# Patient Record
Sex: Female | Born: 1950 | Race: Black or African American | Hispanic: No | Marital: Single | Smoking: Former smoker
Health system: Southern US, Community
[De-identification: ages and names within clinical notes are randomized; demographics above are authoritative.]

## PROBLEM LIST (undated history)

## (undated) DIAGNOSIS — J45909 Unspecified asthma, uncomplicated: Secondary | ICD-10-CM

## (undated) DIAGNOSIS — E119 Type 2 diabetes mellitus without complications: Secondary | ICD-10-CM

## (undated) DIAGNOSIS — I10 Essential (primary) hypertension: Secondary | ICD-10-CM

## (undated) DIAGNOSIS — E7800 Pure hypercholesterolemia, unspecified: Secondary | ICD-10-CM

---

## 1998-11-28 ENCOUNTER — Emergency Department (HOSPITAL_COMMUNITY): Admission: EM | Admit: 1998-11-28 | Discharge: 1998-11-28 | Payer: Self-pay | Admitting: Emergency Medicine

## 1998-11-29 ENCOUNTER — Encounter: Payer: Self-pay | Admitting: Emergency Medicine

## 1998-12-17 ENCOUNTER — Emergency Department (HOSPITAL_COMMUNITY): Admission: EM | Admit: 1998-12-17 | Discharge: 1998-12-17 | Payer: Self-pay | Admitting: Internal Medicine

## 1999-02-23 ENCOUNTER — Emergency Department (HOSPITAL_COMMUNITY): Admission: EM | Admit: 1999-02-23 | Discharge: 1999-02-23 | Payer: Self-pay

## 1999-03-15 ENCOUNTER — Encounter: Admission: RE | Admit: 1999-03-15 | Discharge: 1999-03-15 | Payer: Self-pay | Admitting: Obstetrics

## 2000-02-18 ENCOUNTER — Emergency Department (HOSPITAL_COMMUNITY): Admission: EM | Admit: 2000-02-18 | Discharge: 2000-02-18 | Payer: Self-pay | Admitting: Emergency Medicine

## 2002-01-29 ENCOUNTER — Encounter: Payer: Self-pay | Admitting: Emergency Medicine

## 2002-01-29 ENCOUNTER — Emergency Department (HOSPITAL_COMMUNITY): Admission: EM | Admit: 2002-01-29 | Discharge: 2002-01-29 | Payer: Self-pay | Admitting: Emergency Medicine

## 2003-10-02 ENCOUNTER — Emergency Department (HOSPITAL_COMMUNITY): Admission: EM | Admit: 2003-10-02 | Discharge: 2003-10-02 | Payer: Self-pay | Admitting: Emergency Medicine

## 2003-10-19 ENCOUNTER — Ambulatory Visit (HOSPITAL_COMMUNITY): Admission: RE | Admit: 2003-10-19 | Discharge: 2003-10-19 | Payer: Self-pay | Admitting: Internal Medicine

## 2003-11-03 ENCOUNTER — Ambulatory Visit (HOSPITAL_COMMUNITY): Admission: RE | Admit: 2003-11-03 | Discharge: 2003-11-03 | Payer: Self-pay | Admitting: Family Medicine

## 2003-11-12 ENCOUNTER — Emergency Department (HOSPITAL_COMMUNITY): Admission: EM | Admit: 2003-11-12 | Discharge: 2003-11-12 | Payer: Self-pay | Admitting: *Deleted

## 2003-12-06 ENCOUNTER — Ambulatory Visit: Payer: Self-pay | Admitting: *Deleted

## 2003-12-20 ENCOUNTER — Ambulatory Visit: Payer: Self-pay | Admitting: Internal Medicine

## 2003-12-30 ENCOUNTER — Ambulatory Visit: Payer: Self-pay | Admitting: *Deleted

## 2004-02-05 ENCOUNTER — Emergency Department (HOSPITAL_COMMUNITY): Admission: EM | Admit: 2004-02-05 | Discharge: 2004-02-06 | Payer: Self-pay | Admitting: Emergency Medicine

## 2004-07-24 ENCOUNTER — Ambulatory Visit: Payer: Self-pay | Admitting: Internal Medicine

## 2004-07-25 ENCOUNTER — Ambulatory Visit: Payer: Self-pay | Admitting: Internal Medicine

## 2004-10-12 ENCOUNTER — Ambulatory Visit: Payer: Self-pay | Admitting: Internal Medicine

## 2005-05-13 ENCOUNTER — Ambulatory Visit: Payer: Self-pay | Admitting: Internal Medicine

## 2005-06-12 ENCOUNTER — Ambulatory Visit: Payer: Self-pay | Admitting: Internal Medicine

## 2005-07-03 ENCOUNTER — Ambulatory Visit: Payer: Self-pay | Admitting: Internal Medicine

## 2005-07-23 ENCOUNTER — Encounter: Payer: Self-pay | Admitting: Internal Medicine

## 2005-07-23 ENCOUNTER — Ambulatory Visit: Payer: Self-pay | Admitting: Internal Medicine

## 2006-05-16 ENCOUNTER — Emergency Department (HOSPITAL_COMMUNITY): Admission: EM | Admit: 2006-05-16 | Discharge: 2006-05-16 | Payer: Self-pay | Admitting: Emergency Medicine

## 2006-06-05 ENCOUNTER — Ambulatory Visit: Payer: Self-pay | Admitting: Internal Medicine

## 2006-10-27 ENCOUNTER — Ambulatory Visit: Payer: Self-pay | Admitting: Internal Medicine

## 2006-12-17 ENCOUNTER — Encounter (INDEPENDENT_AMBULATORY_CARE_PROVIDER_SITE_OTHER): Payer: Self-pay | Admitting: *Deleted

## 2007-02-07 ENCOUNTER — Emergency Department (HOSPITAL_COMMUNITY): Admission: EM | Admit: 2007-02-07 | Discharge: 2007-02-07 | Payer: Self-pay | Admitting: Emergency Medicine

## 2007-03-19 ENCOUNTER — Ambulatory Visit: Payer: Self-pay | Admitting: Internal Medicine

## 2007-04-01 ENCOUNTER — Ambulatory Visit (HOSPITAL_COMMUNITY): Admission: RE | Admit: 2007-04-01 | Discharge: 2007-04-01 | Payer: Self-pay | Admitting: Family Medicine

## 2007-09-29 ENCOUNTER — Ambulatory Visit: Payer: Self-pay | Admitting: Internal Medicine

## 2008-02-11 ENCOUNTER — Ambulatory Visit: Payer: Self-pay | Admitting: Internal Medicine

## 2008-02-23 ENCOUNTER — Ambulatory Visit: Payer: Self-pay | Admitting: Internal Medicine

## 2008-04-25 ENCOUNTER — Ambulatory Visit (HOSPITAL_COMMUNITY): Admission: RE | Admit: 2008-04-25 | Discharge: 2008-04-25 | Payer: Self-pay | Admitting: Internal Medicine

## 2009-03-13 ENCOUNTER — Ambulatory Visit (HOSPITAL_COMMUNITY): Admission: RE | Admit: 2009-03-13 | Discharge: 2009-03-13 | Payer: Self-pay | Admitting: Internal Medicine

## 2009-04-17 ENCOUNTER — Ambulatory Visit: Payer: Self-pay | Admitting: Internal Medicine

## 2009-04-26 ENCOUNTER — Ambulatory Visit (HOSPITAL_COMMUNITY): Admission: RE | Admit: 2009-04-26 | Discharge: 2009-04-26 | Payer: Self-pay | Admitting: Internal Medicine

## 2009-06-22 ENCOUNTER — Ambulatory Visit: Payer: Self-pay | Admitting: Internal Medicine

## 2009-10-11 ENCOUNTER — Emergency Department (HOSPITAL_COMMUNITY): Admission: EM | Admit: 2009-10-11 | Discharge: 2009-10-11 | Payer: Self-pay | Admitting: Family Medicine

## 2009-11-20 ENCOUNTER — Ambulatory Visit: Payer: Self-pay | Admitting: Internal Medicine

## 2009-11-20 LAB — CONVERTED CEMR LAB
BUN: 13 mg/dL (ref 6–23)
CO2: 27 meq/L (ref 19–32)
HDL: 39 mg/dL — ABNORMAL LOW (ref >39)
LDL Cholesterol: 107 mg/dL — ABNORMAL HIGH (ref 0–99)
Triglycerides: 107 mg/dL (ref <150)
VLDL: 21 mg/dL (ref 0–40)

## 2010-05-01 ENCOUNTER — Ambulatory Visit (HOSPITAL_COMMUNITY): Admission: RE | Admit: 2010-05-01 | Payer: Self-pay | Source: Home / Self Care | Admitting: Internal Medicine

## 2010-05-01 ENCOUNTER — Other Ambulatory Visit (HOSPITAL_COMMUNITY): Payer: Self-pay | Admitting: Internal Medicine

## 2010-05-01 DIAGNOSIS — Z0000 Encounter for general adult medical examination without abnormal findings: Secondary | ICD-10-CM

## 2010-05-01 DIAGNOSIS — Z1231 Encounter for screening mammogram for malignant neoplasm of breast: Secondary | ICD-10-CM

## 2010-05-05 ENCOUNTER — Encounter: Payer: Self-pay | Admitting: Internal Medicine

## 2010-05-10 ENCOUNTER — Ambulatory Visit (HOSPITAL_COMMUNITY)
Admission: RE | Admit: 2010-05-10 | Discharge: 2010-05-10 | Disposition: A | Discharge status: Home / Self Care | Payer: Self-pay | Source: Ambulatory Visit | Attending: Internal Medicine | Admitting: Internal Medicine

## 2010-05-10 DIAGNOSIS — Z1231 Encounter for screening mammogram for malignant neoplasm of breast: Secondary | ICD-10-CM | POA: Insufficient documentation

## 2010-06-17 LAB — POCT I-STAT, CHEM 8
Calcium, Ion: 1.08 mmol/L — ABNORMAL LOW (ref 1.12–1.32)
Chloride: 101 mEq/L (ref 96–112)
Glucose, Bld: 100 mg/dL — ABNORMAL HIGH (ref 70–99)
Potassium: 3.8 mEq/L (ref 3.5–5.1)

## 2010-06-17 LAB — GLUCOSE, CAPILLARY: Glucose-Capillary: 97 mg/dL (ref 70–99)

## 2011-04-24 ENCOUNTER — Other Ambulatory Visit (HOSPITAL_COMMUNITY): Payer: Self-pay | Admitting: Family Medicine

## 2011-04-24 DIAGNOSIS — Z1231 Encounter for screening mammogram for malignant neoplasm of breast: Secondary | ICD-10-CM

## 2011-05-22 ENCOUNTER — Ambulatory Visit (HOSPITAL_COMMUNITY)
Admission: RE | Admit: 2011-05-22 | Discharge: 2011-05-22 | Disposition: A | Discharge status: Home / Self Care | Payer: Self-pay | Source: Ambulatory Visit | Attending: Family Medicine | Admitting: Family Medicine

## 2011-05-22 DIAGNOSIS — Z1231 Encounter for screening mammogram for malignant neoplasm of breast: Secondary | ICD-10-CM | POA: Insufficient documentation

## 2012-05-22 ENCOUNTER — Other Ambulatory Visit (HOSPITAL_COMMUNITY): Payer: Self-pay | Admitting: Family Medicine

## 2012-05-22 DIAGNOSIS — Z1231 Encounter for screening mammogram for malignant neoplasm of breast: Secondary | ICD-10-CM

## 2012-05-28 ENCOUNTER — Ambulatory Visit (HOSPITAL_COMMUNITY)
Admission: RE | Admit: 2012-05-28 | Discharge: 2012-05-28 | Disposition: A | Discharge status: Home / Self Care | Payer: Self-pay | Source: Ambulatory Visit | Attending: Family Medicine | Admitting: Family Medicine

## 2012-05-28 DIAGNOSIS — Z1231 Encounter for screening mammogram for malignant neoplasm of breast: Secondary | ICD-10-CM | POA: Insufficient documentation

## 2013-07-16 ENCOUNTER — Other Ambulatory Visit (HOSPITAL_COMMUNITY): Payer: Self-pay | Admitting: Pediatrics

## 2013-07-16 DIAGNOSIS — Z1231 Encounter for screening mammogram for malignant neoplasm of breast: Secondary | ICD-10-CM

## 2013-07-22 ENCOUNTER — Ambulatory Visit (HOSPITAL_COMMUNITY): Payer: Self-pay

## 2013-07-27 ENCOUNTER — Ambulatory Visit (HOSPITAL_COMMUNITY)
Admission: RE | Admit: 2013-07-27 | Discharge: 2013-07-27 | Disposition: A | Discharge status: Home / Self Care | Payer: Self-pay | Source: Ambulatory Visit | Attending: Pediatrics | Admitting: Pediatrics

## 2013-07-27 DIAGNOSIS — Z1231 Encounter for screening mammogram for malignant neoplasm of breast: Secondary | ICD-10-CM

## 2014-02-20 ENCOUNTER — Emergency Department (HOSPITAL_COMMUNITY)
Admission: EM | Admit: 2014-02-20 | Discharge: 2014-02-20 | Disposition: A | Discharge status: Home / Self Care | Payer: Medicare HMO | Attending: Emergency Medicine | Admitting: Emergency Medicine

## 2014-02-20 ENCOUNTER — Encounter (HOSPITAL_COMMUNITY): Payer: Self-pay | Admitting: *Deleted

## 2014-02-20 ENCOUNTER — Emergency Department (HOSPITAL_COMMUNITY): Payer: Medicare HMO

## 2014-02-20 DIAGNOSIS — I10 Essential (primary) hypertension: Secondary | ICD-10-CM | POA: Insufficient documentation

## 2014-02-20 DIAGNOSIS — Z880 Allergy status to penicillin: Secondary | ICD-10-CM | POA: Diagnosis not present

## 2014-02-20 DIAGNOSIS — Z7982 Long term (current) use of aspirin: Secondary | ICD-10-CM | POA: Diagnosis not present

## 2014-02-20 DIAGNOSIS — S52601A Unspecified fracture of lower end of right ulna, initial encounter for closed fracture: Secondary | ICD-10-CM | POA: Insufficient documentation

## 2014-02-20 DIAGNOSIS — Z79899 Other long term (current) drug therapy: Secondary | ICD-10-CM | POA: Diagnosis not present

## 2014-02-20 DIAGNOSIS — E119 Type 2 diabetes mellitus without complications: Secondary | ICD-10-CM | POA: Diagnosis not present

## 2014-02-20 DIAGNOSIS — W19XXXA Unspecified fall, initial encounter: Secondary | ICD-10-CM

## 2014-02-20 DIAGNOSIS — T1490XA Injury, unspecified, initial encounter: Secondary | ICD-10-CM

## 2014-02-20 DIAGNOSIS — Y998 Other external cause status: Secondary | ICD-10-CM | POA: Insufficient documentation

## 2014-02-20 DIAGNOSIS — W228XXA Striking against or struck by other objects, initial encounter: Secondary | ICD-10-CM | POA: Diagnosis not present

## 2014-02-20 DIAGNOSIS — Z720 Tobacco use: Secondary | ICD-10-CM | POA: Diagnosis not present

## 2014-02-20 DIAGNOSIS — S6991XA Unspecified injury of right wrist, hand and finger(s), initial encounter: Secondary | ICD-10-CM | POA: Diagnosis present

## 2014-02-20 DIAGNOSIS — Y9289 Other specified places as the place of occurrence of the external cause: Secondary | ICD-10-CM | POA: Insufficient documentation

## 2014-02-20 DIAGNOSIS — Y9389 Activity, other specified: Secondary | ICD-10-CM | POA: Diagnosis not present

## 2014-02-20 HISTORY — DX: Essential (primary) hypertension: I10

## 2014-02-20 HISTORY — DX: Type 2 diabetes mellitus without complications: E11.9

## 2014-02-20 MED ORDER — HYDROCODONE-ACETAMINOPHEN 5-325 MG PO TABS: 1.0000 | ORAL_TABLET | Freq: Four times a day (QID) | ORAL | Status: DC | PRN

## 2014-02-20 NOTE — ED Notes (Signed)
Paged and spoke with ortho tech.

## 2014-02-20 NOTE — ED Notes (Signed)
The ptt has pain and swelling to her rt wrist.  She struck it on a piece of wood outside Friday.

## 2014-02-20 NOTE — ED Provider Notes (Signed)
CSN: [id number]     Arrival date & time 02/20/14  1510 History  This chart was scribed for Noland Fordyce, PA-C, working with Evelina Bucy, MD by Steva Colder, ED Scribe. The patient was seen in room TR04C/TR04C at 4:28 PM.    Chief Complaint  Patient presents with  . Wrist Injury     The history is provided by the patient. No language interpreter was used.   HPI Comments: Jacqueline Simon is a 63 y.o. female with medical hx of DM who presents to the Emergency Department complaining of right wrist injury onset 2 days ago. She rates her pain as 10/10, while moving. Pt reports that she struck it on a piece of wood while outside and cleaning feces from her dog 2 days ago. Her dog was excited to see her and while she was trying to get the dog off she hit her wrist on either the rail or chair. When the pain first occurred, she got sick on the stomach. 2 days ago the wrist had a bump on it that the pt paid no attention too. Saturday is when the pt noticed more pain and swelling. There is pain with movement of her wrist. She states that she is having associated symptoms of joint swelling and wrist pain. Pain is 10/10 at worst, mild to moderate while at rest. She states that she has tried Tylenol with moderate relief for her symptoms. She denies any other associated symptoms. She denies neuropathy. Pt is right handed and has been using her left hand since the incident. Pt is a daily smoker. PCP- Simona Huh, MD   Past Medical History  Diagnosis Date  . Hypertension   . Diabetes mellitus without complication    History reviewed. No pertinent past surgical history. No family history on file. History  Substance Use Topics  . Smoking status: Current Every Day Smoker  . Smokeless tobacco: Not on file  . Alcohol Use: No   OB History    No data available     Review of Systems  Musculoskeletal: Positive for joint swelling and arthralgias.    Allergies  Penicillins  Home Medications   Prior  to Admission medications   Medication Sig Start Date End Date Taking? Authorizing Provider  acetaminophen (TYLENOL) 500 MG tablet Take 1,000 mg by mouth 2 (two) times daily as needed for headache (pain).   Yes Historical Provider, MD  albuterol (PROVENTIL HFA;VENTOLIN HFA) 108 (90 BASE) MCG/ACT inhaler Inhale 1 puff into the lungs at bedtime as needed for wheezing or shortness of breath.   Yes Historical Provider, MD  aspirin EC 81 MG tablet Take 81 mg by mouth daily.   Yes Historical Provider, MD  cetirizine-pseudoephedrine (ZYRTEC-D) 5-120 MG per tablet Take 1 tablet by mouth daily.   Yes Historical Provider, MD  desloratadine (CLARINEX) 5 MG tablet Take 5 mg by mouth daily.   Yes Historical Provider, MD  fluticasone (FLOVENT HFA) 220 MCG/ACT inhaler Inhale 2 puffs into the lungs at bedtime as needed (shortness of breath/wheezing).   Yes Historical Provider, MD  hydrochlorothiazide (HYDRODIURIL) 25 MG tablet Take 25 mg by mouth daily.  02/06/14  Yes Historical Provider, MD  metFORMIN (GLUCOPHAGE-XR) 500 MG 24 hr tablet Take 1,000 mg by mouth 2 (two) times daily.  01/17/14  Yes Historical Provider, MD  Multiple Vitamin (MULTIVITAMIN WITH MINERALS) TABS tablet Take 1 tablet by mouth daily.   Yes Historical Provider, MD  HYDROcodone-acetaminophen (NORCO/VICODIN) 5-325 MG per tablet Take 1-2 tablets by  mouth every 6 (six) hours as needed for moderate pain or severe pain. 02/20/14   Noland Fordyce, PA-C   BP 181/74 mmHg  Pulse 88  Temp(Src) 98 F (36.7 C)  Resp 18  Ht 5\' 3"  (1.6 m)  Wt 205 lb (92.987 kg)  BMI 36.32 kg/m2  SpO2 98%  Physical Exam  Constitutional: She is oriented to person, place, and time. She appears well-developed and well-nourished. No distress.  HENT:  Head: Normocephalic and atraumatic.  Eyes: EOM are normal.  Neck: Neck supple. No tracheal deviation present.  Cardiovascular: Normal rate.   Pulses:      Radial pulses are 2+ on the right side.  Radial pulse is 2+ of the  right wrist.  Pulmonary/Chest: Effort normal. No respiratory distress.  Musculoskeletal: Normal range of motion. She exhibits edema and tenderness.       Right wrist: She exhibits tenderness and swelling.  Moderate edema to the dorsal aspect of the right wrist. With tenderness along the ulnar aspect. Limited extension and flexion of the wrist due to pain. 5/5 grip strength. Full ROM of all the fingers. Sensation intact. cap refill is less than 3 seconds.   Neurological: She is alert and oriented to person, place, and time.  Skin: Skin is warm and dry. No erythema.  Skin intact. No erythema or ecchymosis.   Psychiatric: She has a normal mood and affect. Her behavior is normal.  Nursing note and vitals reviewed.   ED Course  Procedures (including critical care time) DIAGNOSTIC STUDIES: Oxygen Saturation is 98% on room air, normal by my interpretation.    COORDINATION OF CARE: 4:36 PM-Discussed treatment plan which includes right wrist X-ray, right wrist splint, F/U with orthopedist, and pain medication with pt at bedside and pt agreed to plan.   Labs Review Labs Reviewed - No data to display  Imaging Review Dg Wrist Complete Right  02/20/2014   CLINICAL DATA:  63 year old female tripped and fell over dog 2 days ago with blunt trauma, pain on the medial right wrist. Initial encounter.  EXAM: RIGHT WRIST - COMPLETE 3+ VIEW  COMPARISON:  None.  FINDINGS: Nondisplaced oblique fracture through the distal right ulna at the base of the ulnar styloid (see arrows).  Distal radius appears intact. Carpal bone alignment within normal limits. Suboptimal scaphoid view, no scaphoid fracture identified. Visible metacarpals appear intact.  IMPRESSION: Nondisplaced oblique fracture of the distal ulna, through the base of the ulnar styloid.   Electronically Signed   By: Lars Pinks M.D.   On: 02/20/2014 16:28     EKG Interpretation None      MDM   Final diagnoses:  Right distal ulnar fracture, closed,  initial encounter    Right wrist pain and swelling after hitting on chair or pole 2 days ago. Hand is neurovascularly in tact. Plain films: nondisplaced oblique fracture. Ulnar-gutter splint with sling placed in ED. Advised to f/u with Dr. Grandville Silos, orthopedics in 1-2 weeks for recheck and continued management. Pt verbalized understanding and agreement with tx plan.  I personally performed the services described in this documentation, which was scribed in my presence. The recorded information has been reviewed and is accurate.    Noland Fordyce, PA-C 02/20/14 [place], MD 02/20/14 [phone]

## 2014-02-20 NOTE — Progress Notes (Signed)
Orthopedic Tech Progress Note Patient Details:  Jacqueline Simon 1950-06-21 [id number] Applied fiberglass ulnar gutter splint to RUE.  Pulses, sensation, motion intact before and after application.  Capillary refill less than 2 seconds before and after application.  Placed RUE in an arm sling. Ortho Devices Type of Ortho Device: Ulna gutter splint, Arm sling Ortho Device/Splint Location: RUE Ortho Device/Splint Interventions: Application   Darrol Poke 02/20/2014, 5:15 PM

## 2014-02-20 NOTE — Discharge Instructions (Signed)
You may take acetaminophen (tylenol) every 4-6 hours as needed for pain.  Hydrocodone/acetaminophen should be taken only for severe pain as prescribed.  Do not take regular tylenol at the same time as this medication already has tylenol included.  It may cause drowsiness. Do not drive while taking.  Be sure to keep your right hand elevated above your heart by the help of your sling and use pillows when lying in bed.  Elevation will help with swelling and pain.  You may also apply ice over your splint by putting a thin washcloth between ice and splint for 15-20 minutes, 3-4 times a day to help with swelling and pain.  See below for further instructions.

## 2014-04-13 DIAGNOSIS — N951 Menopausal and female climacteric states: Secondary | ICD-10-CM | POA: Diagnosis not present

## 2014-04-13 DIAGNOSIS — Z13820 Encounter for screening for osteoporosis: Secondary | ICD-10-CM | POA: Diagnosis not present

## 2014-05-05 DIAGNOSIS — S52601D Unspecified fracture of lower end of right ulna, subsequent encounter for closed fracture with routine healing: Secondary | ICD-10-CM | POA: Diagnosis not present

## 2014-06-23 DIAGNOSIS — S52601D Unspecified fracture of lower end of right ulna, subsequent encounter for closed fracture with routine healing: Secondary | ICD-10-CM | POA: Diagnosis not present

## 2014-07-27 ENCOUNTER — Encounter: Payer: Self-pay | Admitting: Podiatry

## 2014-07-27 ENCOUNTER — Ambulatory Visit (INDEPENDENT_AMBULATORY_CARE_PROVIDER_SITE_OTHER): Payer: Commercial Managed Care - HMO | Admitting: Podiatry

## 2014-07-27 VITALS — BP 143/82 | HR 87 | Resp 18

## 2014-07-27 DIAGNOSIS — B351 Tinea unguium: Secondary | ICD-10-CM | POA: Diagnosis not present

## 2014-07-27 DIAGNOSIS — M79676 Pain in unspecified toe(s): Secondary | ICD-10-CM | POA: Diagnosis not present

## 2014-07-27 NOTE — Progress Notes (Signed)
   Subjective:    Patient ID: Jacqueline Simon, female    DOB: 1950/12/16, 64 y.o.   MRN: [id number]  HPI  64 year old female presented to the office they with complaint of painful, thick, elongated toenails for which she is unable to trim herself. She denies any redness or drains on nail sites. She denies any history of ulceration, tailor numbness or any claudication symptoms. She states that she does not check her blood sugar and regular basis as she does not have any test strips. No other complaints at this time.   Review of Systems  Constitutional:       SWEATING   HENT: Positive for sinus pressure.   Eyes: Positive for itching.  Gastrointestinal:       BLOATING  Endocrine:       EXCESSIVE THIRST  Skin: Positive for color change.       CHANGE IN NAILS  Hematological:       SLOW TO HEAL  All other systems reviewed and are negative.      Objective:   Physical Exam AAO x3, NAD DP/PT pulses palpable b/l, CRT < 3 sec Protective sensation intact with SWMF, vibratory sensation intact, Achilles tendon reflex intact Nails are hypertrophic, dystrophic, elongated, brittle, discolored 10. There subjective tenderness on nails 1-5 bilaterally. There is no surrounding erythema or drainage along the nail sites. Left plantar heel hyperkeratotic lesion. Upon debridement lesion there is no underlying ulceration, drainage or other clinical signs of infection. No other areas of tenderness to bilateral lower extremities. There is no overlying edema, erythema, increase in warmth bilaterally. MMT 5/5, ROM WNL No open lesions or other pre-ulcerative lesions     Assessment & Plan:  64 year old female with symptomatic onychomycosis, left heel hyperkeratotic lesion -Treatment options discussed including alternatives, risks, complications -Nail sharply debrided 10 without complications to bleeding -Hyperkeratotic lesion sharply debride 1 without complication/bleeding -Discussed importance of daily  foot inspection -Follow-up in 3 months or sooner if any problems are to arise. Call with questions or concerns in the meantime.

## 2014-08-17 ENCOUNTER — Other Ambulatory Visit (HOSPITAL_COMMUNITY): Payer: Self-pay | Admitting: Pediatrics

## 2014-08-17 DIAGNOSIS — Z1231 Encounter for screening mammogram for malignant neoplasm of breast: Secondary | ICD-10-CM

## 2014-08-23 ENCOUNTER — Ambulatory Visit (HOSPITAL_COMMUNITY)
Admission: RE | Admit: 2014-08-23 | Discharge: 2014-08-23 | Disposition: A | Discharge status: Home / Self Care | Payer: Commercial Managed Care - HMO | Source: Ambulatory Visit | Attending: Pediatrics | Admitting: Pediatrics

## 2014-08-23 DIAGNOSIS — Z1231 Encounter for screening mammogram for malignant neoplasm of breast: Secondary | ICD-10-CM | POA: Diagnosis not present

## 2014-09-26 DIAGNOSIS — E780 Pure hypercholesterolemia: Secondary | ICD-10-CM | POA: Diagnosis not present

## 2014-09-26 DIAGNOSIS — J45909 Unspecified asthma, uncomplicated: Secondary | ICD-10-CM | POA: Diagnosis not present

## 2014-09-26 DIAGNOSIS — Z0000 Encounter for general adult medical examination without abnormal findings: Secondary | ICD-10-CM | POA: Diagnosis not present

## 2014-09-26 DIAGNOSIS — I10 Essential (primary) hypertension: Secondary | ICD-10-CM | POA: Diagnosis not present

## 2014-09-26 DIAGNOSIS — Z720 Tobacco use: Secondary | ICD-10-CM | POA: Diagnosis not present

## 2014-09-26 DIAGNOSIS — Z1211 Encounter for screening for malignant neoplasm of colon: Secondary | ICD-10-CM | POA: Diagnosis not present

## 2014-09-26 DIAGNOSIS — J309 Allergic rhinitis, unspecified: Secondary | ICD-10-CM | POA: Diagnosis not present

## 2014-09-26 DIAGNOSIS — E1165 Type 2 diabetes mellitus with hyperglycemia: Secondary | ICD-10-CM | POA: Diagnosis not present

## 2014-10-27 ENCOUNTER — Ambulatory Visit: Payer: Commercial Managed Care - HMO | Admitting: Podiatry

## 2014-11-23 ENCOUNTER — Ambulatory Visit (INDEPENDENT_AMBULATORY_CARE_PROVIDER_SITE_OTHER): Payer: Commercial Managed Care - HMO | Admitting: Podiatry

## 2014-11-23 ENCOUNTER — Encounter: Payer: Self-pay | Admitting: Podiatry

## 2014-11-23 VITALS — BP 162/89 | HR 82 | Resp 18

## 2014-11-23 DIAGNOSIS — M79676 Pain in unspecified toe(s): Secondary | ICD-10-CM | POA: Diagnosis not present

## 2014-11-23 DIAGNOSIS — B351 Tinea unguium: Secondary | ICD-10-CM

## 2014-11-23 NOTE — Progress Notes (Signed)
Patient ID: Jacqueline Simon, female   DOB: Jan 15, 1951, 65 y.o.   MRN: [id number] Complaint:  Visit Type: Patient returns to my office for continued preventative foot care services. Complaint: Patient states" my nails have grown long and thick and become painful to walk and wear shoes" Patient has been diagnosed with DM with no foot complications. The patient presents for preventative foot care services. No changes to ROS  Podiatric Exam: Vascular: dorsalis pedis and posterior tibial pulses are palpable bilateral. Capillary return is immediate. Temperature gradient is WNL. Skin turgor WNL  Sensorium: Diminished  Semmes Weinstein monofilament test. Normal tactile sensation bilaterally. Nail Exam: Pt has thick disfigured discolored nails with subungual debris noted bilateral entire nail hallux through fifth toenails Ulcer Exam: There is no evidence of ulcer or pre-ulcerative changes or infection. Orthopedic Exam: Muscle tone and strength are WNL. No limitations in general ROM. No crepitus or effusions noted. Foot type and digits show no abnormalities. Bony prominences are unremarkable. Skin: No Porokeratosis. No infection or ulcers  Diagnosis:  Onychomycosis, , Pain in right toe, pain in left toes  Treatment & Plan Procedures and Treatment: Consent by patient was obtained for treatment procedures. The patient understood the discussion of treatment and procedures well. All questions were answered thoroughly reviewed. Debridement of mycotic and hypertrophic toenails, 1 through 5 bilateral and clearing of subungual debris. No ulceration, no infection noted.  Return Visit-Office Procedure: Patient instructed to return to the office for a follow up visit 3 months for continued evaluation and treatment.

## 2014-12-06 ENCOUNTER — Other Ambulatory Visit: Payer: Self-pay | Admitting: Gastroenterology

## 2014-12-06 DIAGNOSIS — Z1211 Encounter for screening for malignant neoplasm of colon: Secondary | ICD-10-CM | POA: Diagnosis not present

## 2014-12-06 DIAGNOSIS — K5730 Diverticulosis of large intestine without perforation or abscess without bleeding: Secondary | ICD-10-CM | POA: Diagnosis not present

## 2014-12-06 DIAGNOSIS — D128 Benign neoplasm of rectum: Secondary | ICD-10-CM | POA: Diagnosis not present

## 2014-12-06 DIAGNOSIS — K621 Rectal polyp: Secondary | ICD-10-CM | POA: Diagnosis not present

## 2014-12-06 DIAGNOSIS — K648 Other hemorrhoids: Secondary | ICD-10-CM | POA: Diagnosis not present

## 2015-01-25 DIAGNOSIS — Z23 Encounter for immunization: Secondary | ICD-10-CM | POA: Diagnosis not present

## 2015-02-15 ENCOUNTER — Encounter: Payer: Self-pay | Admitting: Podiatry

## 2015-02-15 ENCOUNTER — Ambulatory Visit (INDEPENDENT_AMBULATORY_CARE_PROVIDER_SITE_OTHER): Payer: Commercial Managed Care - HMO | Admitting: Podiatry

## 2015-02-15 DIAGNOSIS — M79676 Pain in unspecified toe(s): Secondary | ICD-10-CM

## 2015-02-15 DIAGNOSIS — B351 Tinea unguium: Secondary | ICD-10-CM

## 2015-02-15 NOTE — Progress Notes (Signed)
Patient ID: Jacqueline Simon, female   DOB: Mar 09, 1951, 64 y.o.   MRN: [id number] Complaint:  Visit Type: Patient returns to my office for continued preventative foot care services. Complaint: Patient states" my nails have grown long and thick and become painful to walk and wear shoes" Patient has been diagnosed with DM with no foot complications. The patient presents for preventative foot care services. No changes to ROS  Podiatric Exam: Vascular: dorsalis pedis and posterior tibial pulses are palpable bilateral. Capillary return is immediate. Temperature gradient is WNL. Skin turgor WNL  Sensorium: Diminished  Semmes Weinstein monofilament test. Normal tactile sensation bilaterally. Nail Exam: Pt has thick disfigured discolored nails with subungual debris noted bilateral entire nail hallux through fifth toenails Ulcer Exam: There is no evidence of ulcer or pre-ulcerative changes or infection. Orthopedic Exam: Muscle tone and strength are WNL. No limitations in general ROM. No crepitus or effusions noted. Foot type and digits show no abnormalities. Bony prominences are unremarkable. Skin: No Porokeratosis. No infection or ulcers  Diagnosis:  Onychomycosis, , Pain in right toe, pain in left toes  Treatment & Plan Procedures and Treatment: Consent by patient was obtained for treatment procedures. The patient understood the discussion of treatment and procedures well. All questions were answered thoroughly reviewed. Debridement of mycotic and hypertrophic toenails, 1 through 5 bilateral and clearing of subungual debris. No ulceration, no infection noted.  Return Visit-Office Procedure: Patient instructed to return to the office for a follow up visit 3 months for continued evaluation and treatment.  Gardiner Barefoot DPM

## 2015-04-12 DIAGNOSIS — J309 Allergic rhinitis, unspecified: Secondary | ICD-10-CM | POA: Diagnosis not present

## 2015-04-12 DIAGNOSIS — I10 Essential (primary) hypertension: Secondary | ICD-10-CM | POA: Diagnosis not present

## 2015-04-12 DIAGNOSIS — E1165 Type 2 diabetes mellitus with hyperglycemia: Secondary | ICD-10-CM | POA: Diagnosis not present

## 2015-04-12 DIAGNOSIS — Z7984 Long term (current) use of oral hypoglycemic drugs: Secondary | ICD-10-CM | POA: Diagnosis not present

## 2015-04-12 DIAGNOSIS — E7800 Pure hypercholesterolemia, unspecified: Secondary | ICD-10-CM | POA: Diagnosis not present

## 2015-04-12 DIAGNOSIS — Z720 Tobacco use: Secondary | ICD-10-CM | POA: Diagnosis not present

## 2015-04-12 DIAGNOSIS — J45909 Unspecified asthma, uncomplicated: Secondary | ICD-10-CM | POA: Diagnosis not present

## 2015-04-27 ENCOUNTER — Ambulatory Visit (INDEPENDENT_AMBULATORY_CARE_PROVIDER_SITE_OTHER): Payer: Commercial Managed Care - HMO | Admitting: Podiatry

## 2015-04-27 ENCOUNTER — Encounter: Payer: Self-pay | Admitting: Podiatry

## 2015-04-27 DIAGNOSIS — M79676 Pain in unspecified toe(s): Secondary | ICD-10-CM

## 2015-04-27 DIAGNOSIS — B351 Tinea unguium: Secondary | ICD-10-CM

## 2015-04-27 NOTE — Progress Notes (Signed)
Patient ID: Jacqueline Simon, female   DOB: [birth year], 65 y.o.   MRN: [id number] Complaint:  Visit Type: Patient returns to my office for continued preventative foot care services. Complaint: Patient states" my nails have grown long and thick and become painful to walk and wear shoes" Patient has been diagnosed with DM with no foot complications. The patient presents for preventative foot care services. No changes to ROS  Podiatric Exam: Vascular: dorsalis pedis and posterior tibial pulses are palpable bilateral. Capillary return is immediate. Temperature gradient is WNL. Skin turgor WNL  Sensorium: Diminished  Semmes Weinstein monofilament test. Normal tactile sensation bilaterally. Nail Exam: Pt has thick disfigured discolored nails with subungual debris noted bilateral entire nail hallux through fifth toenails Ulcer Exam: There is no evidence of ulcer or pre-ulcerative changes or infection. Orthopedic Exam: Muscle tone and strength are WNL. No limitations in general ROM. No crepitus or effusions noted. Foot type and digits show no abnormalities. Bony prominences are unremarkable. Skin: No Porokeratosis. No infection or ulcers  Diagnosis:  Onychomycosis, , Pain in right toe, pain in left toes  Treatment & Plan Procedures and Treatment: Consent by patient was obtained for treatment procedures. The patient understood the discussion of treatment and procedures well. All questions were answered thoroughly reviewed. Debridement of mycotic and hypertrophic toenails, 1 through 5 bilateral and clearing of subungual debris. No ulceration, no infection noted.  Return Visit-Office Procedure: Patient instructed to return to the office for a follow up visit 3 months for continued evaluation and treatment.  Gardiner Barefoot DPM

## 2015-06-05 DIAGNOSIS — I10 Essential (primary) hypertension: Secondary | ICD-10-CM | POA: Diagnosis not present

## 2015-06-05 DIAGNOSIS — Z720 Tobacco use: Secondary | ICD-10-CM | POA: Diagnosis not present

## 2015-06-28 DIAGNOSIS — J45909 Unspecified asthma, uncomplicated: Secondary | ICD-10-CM | POA: Diagnosis not present

## 2015-06-28 DIAGNOSIS — I10 Essential (primary) hypertension: Secondary | ICD-10-CM | POA: Diagnosis not present

## 2015-07-27 ENCOUNTER — Ambulatory Visit (INDEPENDENT_AMBULATORY_CARE_PROVIDER_SITE_OTHER): Payer: Commercial Managed Care - HMO | Admitting: Podiatry

## 2015-07-27 ENCOUNTER — Encounter: Payer: Self-pay | Admitting: Podiatry

## 2015-07-27 DIAGNOSIS — M79676 Pain in unspecified toe(s): Secondary | ICD-10-CM | POA: Diagnosis not present

## 2015-07-27 DIAGNOSIS — B351 Tinea unguium: Secondary | ICD-10-CM | POA: Diagnosis not present

## 2015-07-27 NOTE — Progress Notes (Signed)
Patient ID: Jacqueline Simon, female   DOB: 09/22/1950, 65 y.o.   MRN: [id number] Complaint:  Visit Type: Patient returns to my office for continued preventative foot care services. Complaint: Patient states" my nails have grown long and thick and become painful to walk and wear shoes" Patient has been diagnosed with DM with no foot complications. The patient presents for preventative foot care services. No changes to ROS  Podiatric Exam: Vascular: dorsalis pedis and posterior tibial pulses are palpable bilateral. Capillary return is immediate. Temperature gradient is WNL. Skin turgor WNL  Sensorium: Diminished  Semmes Weinstein monofilament test. Normal tactile sensation bilaterally. Nail Exam: Pt has thick disfigured discolored nails with subungual debris noted bilateral entire nail hallux through fifth toenails Ulcer Exam: There is no evidence of ulcer or pre-ulcerative changes or infection. Orthopedic Exam: Muscle tone and strength are WNL. No limitations in general ROM. No crepitus or effusions noted. Foot type and digits show no abnormalities. Bony prominences are unremarkable. Skin: No Porokeratosis. No infection or ulcers  Diagnosis:  Onychomycosis, , Pain in right toe, pain in left toes  Treatment & Plan Procedures and Treatment: Consent by patient was obtained for treatment procedures. The patient understood the discussion of treatment and procedures well. All questions were answered thoroughly reviewed. Debridement of mycotic and hypertrophic toenails, 1 through 5 bilateral and clearing of subungual debris. No ulceration, no infection noted.  Return Visit-Office Procedure: Patient instructed to return to the office for a follow up visit 3 months for continued evaluation and treatment.  Gardiner Barefoot DPM

## 2015-09-20 ENCOUNTER — Other Ambulatory Visit: Payer: Self-pay | Admitting: Pediatrics

## 2015-09-20 DIAGNOSIS — Z139 Encounter for screening, unspecified: Secondary | ICD-10-CM

## 2015-10-18 ENCOUNTER — Ambulatory Visit: Payer: Commercial Managed Care - HMO | Admitting: Podiatry

## 2015-10-20 ENCOUNTER — Ambulatory Visit: Payer: Self-pay

## 2015-11-01 ENCOUNTER — Ambulatory Visit: Payer: Commercial Managed Care - HMO | Admitting: Podiatry

## 2015-11-09 ENCOUNTER — Ambulatory Visit: Payer: Self-pay

## 2015-11-16 ENCOUNTER — Ambulatory Visit: Payer: Self-pay

## 2015-11-23 ENCOUNTER — Ambulatory Visit (INDEPENDENT_AMBULATORY_CARE_PROVIDER_SITE_OTHER): Payer: Commercial Managed Care - HMO | Admitting: Podiatry

## 2015-11-23 DIAGNOSIS — B351 Tinea unguium: Secondary | ICD-10-CM

## 2015-11-23 DIAGNOSIS — M79676 Pain in unspecified toe(s): Secondary | ICD-10-CM | POA: Diagnosis not present

## 2015-11-23 NOTE — Progress Notes (Signed)
Patient ID: Jacqueline Simon, female   DOB: 04/17/1950, 65 y.o.   MRN: [id number] Complaint:  Visit Type: Patient returns to my office for continued preventative foot care services. Complaint: Patient states" my nails have grown long and thick and become painful to walk and wear shoes" Patient has been diagnosed with DM with no foot complications. The patient presents for preventative foot care services. No changes to ROS  Podiatric Exam: Vascular: dorsalis pedis and posterior tibial pulses are palpable bilateral. Capillary return is immediate. Temperature gradient is WNL. Skin turgor WNL  Sensorium: Diminished  Semmes Weinstein monofilament test. Normal tactile sensation bilaterally. Nail Exam: Pt has thick disfigured discolored nails with subungual debris noted bilateral entire nail hallux through fifth toenails Ulcer Exam: There is no evidence of ulcer or pre-ulcerative changes or infection. Orthopedic Exam: Muscle tone and strength are WNL. No limitations in general ROM. No crepitus or effusions noted. Foot type and digits show no abnormalities. Bony prominences are unremarkable. Skin: No Porokeratosis. No infection or ulcers  Diagnosis:  Onychomycosis, , Pain in right toe, pain in left toes  Treatment & Plan Procedures and Treatment: Consent by patient was obtained for treatment procedures. The patient understood the discussion of treatment and procedures well. All questions were answered thoroughly reviewed. Debridement of mycotic and hypertrophic toenails, 1 through 5 bilateral and clearing of subungual debris. No ulceration, no infection noted.  Return Visit-Office Procedure: Patient instructed to return to the office for a follow up visit 3 months for continued evaluation and treatment.  Robby Pirani DPM 

## 2015-11-27 ENCOUNTER — Ambulatory Visit
Admission: RE | Admit: 2015-11-27 | Discharge: 2015-11-27 | Disposition: A | Discharge status: Home / Self Care | Payer: Commercial Managed Care - HMO | Source: Ambulatory Visit | Attending: Pediatrics | Admitting: Pediatrics

## 2015-11-27 DIAGNOSIS — Z139 Encounter for screening, unspecified: Secondary | ICD-10-CM

## 2015-11-27 DIAGNOSIS — Z1231 Encounter for screening mammogram for malignant neoplasm of breast: Secondary | ICD-10-CM | POA: Diagnosis not present

## 2016-01-17 DIAGNOSIS — J45909 Unspecified asthma, uncomplicated: Secondary | ICD-10-CM | POA: Diagnosis not present

## 2016-01-17 DIAGNOSIS — I10 Essential (primary) hypertension: Secondary | ICD-10-CM | POA: Diagnosis not present

## 2016-01-17 DIAGNOSIS — E7800 Pure hypercholesterolemia, unspecified: Secondary | ICD-10-CM | POA: Diagnosis not present

## 2016-01-17 DIAGNOSIS — Z23 Encounter for immunization: Secondary | ICD-10-CM | POA: Diagnosis not present

## 2016-01-17 DIAGNOSIS — E1165 Type 2 diabetes mellitus with hyperglycemia: Secondary | ICD-10-CM | POA: Diagnosis not present

## 2016-01-17 DIAGNOSIS — Z720 Tobacco use: Secondary | ICD-10-CM | POA: Diagnosis not present

## 2016-01-17 DIAGNOSIS — J309 Allergic rhinitis, unspecified: Secondary | ICD-10-CM | POA: Diagnosis not present

## 2016-01-19 ENCOUNTER — Other Ambulatory Visit: Payer: Self-pay | Admitting: Family Medicine

## 2016-01-19 DIAGNOSIS — Z139 Encounter for screening, unspecified: Secondary | ICD-10-CM

## 2016-02-29 ENCOUNTER — Encounter: Payer: Self-pay | Admitting: Podiatry

## 2016-02-29 ENCOUNTER — Ambulatory Visit (INDEPENDENT_AMBULATORY_CARE_PROVIDER_SITE_OTHER): Payer: Commercial Managed Care - HMO | Admitting: Podiatry

## 2016-02-29 DIAGNOSIS — B351 Tinea unguium: Secondary | ICD-10-CM | POA: Diagnosis not present

## 2016-02-29 DIAGNOSIS — M79676 Pain in unspecified toe(s): Secondary | ICD-10-CM | POA: Diagnosis not present

## 2016-02-29 NOTE — Progress Notes (Signed)
Patient ID: Denetria B Mcdonell, female   DOB: 05/10/1950, 65 y.o.   MRN: [id number] Complaint:  Visit Type: Patient returns to my office for continued preventative foot care services. Complaint: Patient states" my nails have grown long and thick and become painful to walk and wear shoes" Patient has been diagnosed with DM with no foot complications. The patient presents for preventative foot care services. No changes to ROS  Podiatric Exam: Vascular: dorsalis pedis and posterior tibial pulses are palpable bilateral. Capillary return is immediate. Temperature gradient is WNL. Skin turgor WNL  Sensorium: Diminished  Semmes Weinstein monofilament test. Normal tactile sensation bilaterally. Nail Exam: Pt has thick disfigured discolored nails with subungual debris noted bilateral entire nail hallux through fifth toenails Ulcer Exam: There is no evidence of ulcer or pre-ulcerative changes or infection. Orthopedic Exam: Muscle tone and strength are WNL. No limitations in general ROM. No crepitus or effusions noted. Foot type and digits show no abnormalities. Bony prominences are unremarkable. Skin: No Porokeratosis. No infection or ulcers  Diagnosis:  Onychomycosis, , Pain in right toe, pain in left toes  Treatment & Plan Procedures and Treatment: Consent by patient was obtained for treatment procedures. The patient understood the discussion of treatment and procedures well. All questions were answered thoroughly reviewed. Debridement of mycotic and hypertrophic toenails, 1 through 5 bilateral and clearing of subungual debris. No ulceration, no infection noted.  Return Visit-Office Procedure: Patient instructed to return to the office for a follow up visit 3 months for continued evaluation and treatment.  Shanekqua Schaper DPM 

## 2016-05-23 ENCOUNTER — Ambulatory Visit: Payer: Commercial Managed Care - HMO | Admitting: Podiatry

## 2016-06-06 ENCOUNTER — Ambulatory Visit: Payer: Medicare HMO | Admitting: Podiatry

## 2016-06-12 ENCOUNTER — Encounter: Payer: Self-pay | Admitting: Podiatry

## 2016-06-12 ENCOUNTER — Ambulatory Visit (INDEPENDENT_AMBULATORY_CARE_PROVIDER_SITE_OTHER): Payer: Medicare HMO | Admitting: Podiatry

## 2016-06-12 DIAGNOSIS — M79676 Pain in unspecified toe(s): Secondary | ICD-10-CM

## 2016-06-12 DIAGNOSIS — B351 Tinea unguium: Secondary | ICD-10-CM

## 2016-06-12 NOTE — Progress Notes (Signed)
Patient ID: SHANESHA BEDNARZ, female   DOB: 24-Sep-1950, 66 y.o.   MRN: [id number] Complaint:  Visit Type: Patient returns to my office for continued preventative foot care services. Complaint: Patient states" my nails have grown long and thick and become painful to walk and wear shoes" Patient has been diagnosed with DM with no foot complications. The patient presents for preventative foot care services. No changes to ROS  Podiatric Exam: Vascular: dorsalis pedis and posterior tibial pulses are palpable bilateral. Capillary return is immediate. Temperature gradient is WNL. Skin turgor WNL  Sensorium: Diminished  Semmes Weinstein monofilament test. Normal tactile sensation bilaterally. Nail Exam: Pt has thick disfigured discolored nails with subungual debris noted bilateral entire nail hallux through fifth toenails Ulcer Exam: There is no evidence of ulcer or pre-ulcerative changes or infection. Orthopedic Exam: Muscle tone and strength are WNL. No limitations in general ROM. No crepitus or effusions noted. Foot type and digits show no abnormalities. Bony prominences are unremarkable. Skin: No Porokeratosis. No infection or ulcers  Diagnosis:  Onychomycosis, , Pain in right toe, pain in left toes  Treatment & Plan Procedures and Treatment: Consent by patient was obtained for treatment procedures. The patient understood the discussion of treatment and procedures well. All questions were answered thoroughly reviewed. Debridement of mycotic and hypertrophic toenails, 1 through 5 bilateral and clearing of subungual debris. No ulceration, no infection noted.  Return Visit-Office Procedure: Patient instructed to return to the office for a follow up visit 3 months for continued evaluation and treatment.  Gardiner Barefoot DPM

## 2016-07-08 DIAGNOSIS — H40013 Open angle with borderline findings, low risk, bilateral: Secondary | ICD-10-CM | POA: Diagnosis not present

## 2016-07-08 DIAGNOSIS — H25013 Cortical age-related cataract, bilateral: Secondary | ICD-10-CM | POA: Diagnosis not present

## 2016-07-08 DIAGNOSIS — H2513 Age-related nuclear cataract, bilateral: Secondary | ICD-10-CM | POA: Diagnosis not present

## 2016-07-08 DIAGNOSIS — E119 Type 2 diabetes mellitus without complications: Secondary | ICD-10-CM | POA: Diagnosis not present

## 2016-09-10 DIAGNOSIS — Z7984 Long term (current) use of oral hypoglycemic drugs: Secondary | ICD-10-CM | POA: Diagnosis not present

## 2016-09-10 DIAGNOSIS — E7800 Pure hypercholesterolemia, unspecified: Secondary | ICD-10-CM | POA: Diagnosis not present

## 2016-09-10 DIAGNOSIS — I10 Essential (primary) hypertension: Secondary | ICD-10-CM | POA: Diagnosis not present

## 2016-09-10 DIAGNOSIS — Z720 Tobacco use: Secondary | ICD-10-CM | POA: Diagnosis not present

## 2016-09-10 DIAGNOSIS — E1165 Type 2 diabetes mellitus with hyperglycemia: Secondary | ICD-10-CM | POA: Diagnosis not present

## 2016-09-10 DIAGNOSIS — J309 Allergic rhinitis, unspecified: Secondary | ICD-10-CM | POA: Diagnosis not present

## 2016-09-10 DIAGNOSIS — J45909 Unspecified asthma, uncomplicated: Secondary | ICD-10-CM | POA: Diagnosis not present

## 2016-09-11 ENCOUNTER — Ambulatory Visit (INDEPENDENT_AMBULATORY_CARE_PROVIDER_SITE_OTHER): Payer: Medicare HMO | Admitting: Podiatry

## 2016-09-11 ENCOUNTER — Encounter: Payer: Self-pay | Admitting: Podiatry

## 2016-09-11 DIAGNOSIS — B351 Tinea unguium: Secondary | ICD-10-CM

## 2016-09-11 DIAGNOSIS — M79676 Pain in unspecified toe(s): Secondary | ICD-10-CM | POA: Diagnosis not present

## 2016-09-11 NOTE — Progress Notes (Signed)
Patient ID: Jacqueline Simon, female   DOB: 06-25-1950, 65 y.o.   MRN: [id number] Complaint:  Visit Type: Patient returns to my office for continued preventative foot care services. Complaint: Patient states" my nails have grown long and thick and become painful to walk and wear shoes" Patient has been diagnosed with DM with no foot complications. The patient presents for preventative foot care services. No changes to ROS  Podiatric Exam: Vascular: dorsalis pedis and posterior tibial pulses are palpable bilateral. Capillary return is immediate. Temperature gradient is WNL. Skin turgor WNL  Sensorium: Diminished  Semmes Weinstein monofilament test. Normal tactile sensation bilaterally. Nail Exam: Pt has thick disfigured discolored nails with subungual debris noted bilateral entire nail hallux through fifth toenails Ulcer Exam: There is no evidence of ulcer or pre-ulcerative changes or infection. Orthopedic Exam: Muscle tone and strength are WNL. No limitations in general ROM. No crepitus or effusions noted. Foot type and digits show no abnormalities. Bony prominences are unremarkable. Skin: No Porokeratosis. No infection or ulcers  Diagnosis:  Onychomycosis, , Pain in right toe, pain in left toes  Treatment & Plan Procedures and Treatment: Consent by patient was obtained for treatment procedures. The patient understood the discussion of treatment and procedures well. All questions were answered thoroughly reviewed. Debridement of mycotic and hypertrophic toenails, 1 through 5 bilateral and clearing of subungual debris. No ulceration, no infection noted.  Return Visit-Office Procedure: Patient instructed to return to the office for a follow up visit 3 months for continued evaluation and treatment.  Gardiner Barefoot DPM

## 2016-11-22 ENCOUNTER — Other Ambulatory Visit: Payer: Self-pay | Admitting: Family Medicine

## 2016-11-22 DIAGNOSIS — Z1231 Encounter for screening mammogram for malignant neoplasm of breast: Secondary | ICD-10-CM

## 2016-12-11 ENCOUNTER — Ambulatory Visit: Payer: Self-pay

## 2016-12-18 ENCOUNTER — Encounter: Payer: Self-pay | Admitting: Podiatry

## 2016-12-18 ENCOUNTER — Ambulatory Visit (INDEPENDENT_AMBULATORY_CARE_PROVIDER_SITE_OTHER): Payer: Medicare HMO | Admitting: Podiatry

## 2016-12-18 DIAGNOSIS — M79675 Pain in left toe(s): Secondary | ICD-10-CM | POA: Diagnosis not present

## 2016-12-18 DIAGNOSIS — M79674 Pain in right toe(s): Secondary | ICD-10-CM

## 2016-12-18 DIAGNOSIS — B351 Tinea unguium: Secondary | ICD-10-CM

## 2016-12-18 NOTE — Progress Notes (Signed)
Patient ID: Jacqueline Simon, female   DOB: 11/08/1950, 66 y.o.   MRN: [id number] Complaint:  Visit Type: Patient returns to my office for continued preventative foot care services. Complaint: Patient states" my nails have grown long and thick and become painful to walk and wear shoes" Patient has been diagnosed with DM with no foot complications. The patient presents for preventative foot care services. No changes to ROS  Podiatric Exam: Vascular: dorsalis pedis and posterior tibial pulses are palpable bilateral. Capillary return is immediate. Temperature gradient is WNL. Skin turgor WNL  Sensorium: Diminished  Semmes Weinstein monofilament test. Normal tactile sensation bilaterally. Nail Exam: Pt has thick disfigured discolored nails with subungual debris noted bilateral entire nail hallux through fifth toenails Ulcer Exam: There is no evidence of ulcer or pre-ulcerative changes or infection. Orthopedic Exam: Muscle tone and strength are WNL. No limitations in general ROM. No crepitus or effusions noted. Foot type and digits show no abnormalities. Bony prominences are unremarkable. Skin: No Porokeratosis. No infection or ulcers  Diagnosis:  Onychomycosis, , Pain in right toe, pain in left toes  Treatment & Plan Procedures and Treatment: Consent by patient was obtained for treatment procedures. The patient understood the discussion of treatment and procedures well. All questions were answered thoroughly reviewed. Debridement of mycotic and hypertrophic toenails, 1 through 5 bilateral and clearing of subungual debris. No ulceration, no infection noted.  Return Visit-Office Procedure: Patient instructed to return to the office for a follow up visit 10 weeks  for continued evaluation and treatment.  Gardiner Barefoot DPM

## 2016-12-20 ENCOUNTER — Ambulatory Visit
Admission: RE | Admit: 2016-12-20 | Discharge: 2016-12-20 | Disposition: A | Discharge status: Home / Self Care | Payer: Commercial Managed Care - HMO | Source: Ambulatory Visit | Attending: Family Medicine | Admitting: Family Medicine

## 2016-12-20 DIAGNOSIS — Z1231 Encounter for screening mammogram for malignant neoplasm of breast: Secondary | ICD-10-CM

## 2017-01-29 DIAGNOSIS — H04123 Dry eye syndrome of bilateral lacrimal glands: Secondary | ICD-10-CM | POA: Diagnosis not present

## 2017-01-29 DIAGNOSIS — H1013 Acute atopic conjunctivitis, bilateral: Secondary | ICD-10-CM | POA: Diagnosis not present

## 2017-01-29 DIAGNOSIS — H04213 Epiphora due to excess lacrimation, bilateral lacrimal glands: Secondary | ICD-10-CM | POA: Diagnosis not present

## 2017-01-29 DIAGNOSIS — H40013 Open angle with borderline findings, low risk, bilateral: Secondary | ICD-10-CM | POA: Diagnosis not present

## 2017-02-26 ENCOUNTER — Encounter: Payer: Self-pay | Admitting: Podiatry

## 2017-02-26 ENCOUNTER — Ambulatory Visit (INDEPENDENT_AMBULATORY_CARE_PROVIDER_SITE_OTHER): Payer: Medicare HMO | Admitting: Podiatry

## 2017-02-26 DIAGNOSIS — B351 Tinea unguium: Secondary | ICD-10-CM | POA: Diagnosis not present

## 2017-02-26 DIAGNOSIS — M79675 Pain in left toe(s): Secondary | ICD-10-CM | POA: Diagnosis not present

## 2017-02-26 DIAGNOSIS — M79676 Pain in unspecified toe(s): Secondary | ICD-10-CM

## 2017-02-26 DIAGNOSIS — M79674 Pain in right toe(s): Secondary | ICD-10-CM

## 2017-02-26 NOTE — Progress Notes (Signed)
Patient ID: Jacqueline Simon, female   DOB: 1950-06-29, 66 y.o.   MRN: [id number] Complaint:  Visit Type: Patient returns to my office for continued preventative foot care services. Complaint: Patient states" my nails have grown long and thick and become painful to walk and wear shoes" Patient has been diagnosed with DM with no foot complications. The patient presents for preventative foot care services. No changes to ROS  Podiatric Exam: Vascular: dorsalis pedis and posterior tibial pulses are palpable bilateral. Capillary return is immediate. Temperature gradient is WNL. Skin turgor WNL  Sensorium: Diminished  Semmes Weinstein monofilament test. Normal tactile sensation bilaterally. Nail Exam: Pt has thick disfigured discolored nails with subungual debris noted bilateral entire nail hallux through fifth toenails Ulcer Exam: There is no evidence of ulcer or pre-ulcerative changes or infection. Orthopedic Exam: Muscle tone and strength are WNL. No limitations in general ROM. No crepitus or effusions noted. Foot type and digits show no abnormalities. Bony prominences are unremarkable. Skin: No Porokeratosis. No infection or ulcers  Diagnosis:  Onychomycosis, , Pain in right toe, pain in left toes  Treatment & Plan Procedures and Treatment: Consent by patient was obtained for treatment procedures. The patient understood the discussion of treatment and procedures well. All questions were answered thoroughly reviewed. Debridement of mycotic and hypertrophic toenails, 1 through 5 bilateral and clearing of subungual debris. No ulceration, no infection noted.  Return Visit-Office Procedure: Patient instructed to return to the office for a follow up visit 10 weeks  for continued evaluation and treatment.  Gardiner Barefoot DPM

## 2017-05-07 ENCOUNTER — Ambulatory Visit (INDEPENDENT_AMBULATORY_CARE_PROVIDER_SITE_OTHER): Payer: Medicare HMO | Admitting: Podiatry

## 2017-05-07 ENCOUNTER — Encounter: Payer: Self-pay | Admitting: Podiatry

## 2017-05-07 DIAGNOSIS — E1142 Type 2 diabetes mellitus with diabetic polyneuropathy: Secondary | ICD-10-CM

## 2017-05-07 DIAGNOSIS — M79674 Pain in right toe(s): Secondary | ICD-10-CM | POA: Diagnosis not present

## 2017-05-07 DIAGNOSIS — M79675 Pain in left toe(s): Secondary | ICD-10-CM

## 2017-05-07 DIAGNOSIS — B351 Tinea unguium: Secondary | ICD-10-CM

## 2017-05-07 NOTE — Progress Notes (Signed)
Patient ID: Jacqueline Simon, female   DOB: 1951/03/05, 67 y.o.   MRN: [id number] Complaint:  Visit Type: Patient returns to my office for continued preventative foot care services. Complaint: Patient states" my nails have grown long and thick and become painful to walk and wear shoes" Patient has been diagnosed with DM with no foot complications. The patient presents for preventative foot care services. No changes to ROS  Podiatric Exam: Vascular: dorsalis pedis and posterior tibial pulses are palpable bilateral. Capillary return is immediate. Temperature gradient is WNL. Skin turgor WNL  Sensorium: Diminished  Semmes Weinstein monofilament test. Normal tactile sensation bilaterally. Nail Exam: Pt has thick disfigured discolored nails with subungual debris noted bilateral entire nail hallux through fifth toenails Ulcer Exam: There is no evidence of ulcer or pre-ulcerative changes or infection. Orthopedic Exam: Muscle tone and strength are WNL. No limitations in general ROM. No crepitus or effusions noted. Foot type and digits show no abnormalities. Bony prominences are unremarkable. Skin: No Porokeratosis. No infection or ulcers  Diagnosis:  Onychomycosis, , Pain in right toe, pain in left toes  Treatment & Plan Procedures and Treatment: Consent by patient was obtained for treatment procedures. The patient understood the discussion of treatment and procedures well. All questions were answered thoroughly reviewed. Debridement of mycotic and hypertrophic toenails, 1 through 5 bilateral and clearing of subungual debris. No ulceration, no infection noted.  Return Visit-Office Procedure: Patient instructed to return to the office for a follow up visit 10 weeks  for continued evaluation and treatment.  Gardiner Barefoot DPM

## 2017-05-12 ENCOUNTER — Ambulatory Visit: Payer: Medicare HMO | Admitting: Orthotics

## 2017-05-12 DIAGNOSIS — M79675 Pain in left toe(s): Principal | ICD-10-CM

## 2017-05-12 DIAGNOSIS — E1142 Type 2 diabetes mellitus with diabetic polyneuropathy: Secondary | ICD-10-CM

## 2017-05-12 DIAGNOSIS — B351 Tinea unguium: Secondary | ICD-10-CM

## 2017-05-12 DIAGNOSIS — M2041 Other hammer toe(s) (acquired), right foot: Secondary | ICD-10-CM

## 2017-05-12 DIAGNOSIS — M2042 Other hammer toe(s) (acquired), left foot: Secondary | ICD-10-CM

## 2017-05-12 DIAGNOSIS — M79674 Pain in right toe(s): Principal | ICD-10-CM

## 2017-05-12 NOTE — Progress Notes (Signed)
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  patient was measured and found to be 9.5 med; however, she has always worn an 8.5.    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  [place] chose [phone]

## 2017-05-21 DIAGNOSIS — J309 Allergic rhinitis, unspecified: Secondary | ICD-10-CM | POA: Diagnosis not present

## 2017-05-21 DIAGNOSIS — B349 Viral infection, unspecified: Secondary | ICD-10-CM | POA: Diagnosis not present

## 2017-05-21 DIAGNOSIS — I10 Essential (primary) hypertension: Secondary | ICD-10-CM | POA: Diagnosis not present

## 2017-05-21 DIAGNOSIS — J45909 Unspecified asthma, uncomplicated: Secondary | ICD-10-CM | POA: Diagnosis not present

## 2017-05-21 DIAGNOSIS — E1165 Type 2 diabetes mellitus with hyperglycemia: Secondary | ICD-10-CM | POA: Diagnosis not present

## 2017-05-21 DIAGNOSIS — Z720 Tobacco use: Secondary | ICD-10-CM | POA: Diagnosis not present

## 2017-05-21 DIAGNOSIS — E7800 Pure hypercholesterolemia, unspecified: Secondary | ICD-10-CM | POA: Diagnosis not present

## 2017-06-11 ENCOUNTER — Ambulatory Visit (INDEPENDENT_AMBULATORY_CARE_PROVIDER_SITE_OTHER): Payer: Medicare HMO | Admitting: Orthotics

## 2017-06-11 DIAGNOSIS — M2042 Other hammer toe(s) (acquired), left foot: Secondary | ICD-10-CM

## 2017-06-11 DIAGNOSIS — M2041 Other hammer toe(s) (acquired), right foot: Secondary | ICD-10-CM | POA: Diagnosis not present

## 2017-06-11 DIAGNOSIS — E1142 Type 2 diabetes mellitus with diabetic polyneuropathy: Secondary | ICD-10-CM | POA: Diagnosis not present

## 2017-06-11 NOTE — Progress Notes (Signed)

## 2017-07-16 ENCOUNTER — Ambulatory Visit (INDEPENDENT_AMBULATORY_CARE_PROVIDER_SITE_OTHER): Payer: Medicare HMO | Admitting: Podiatry

## 2017-07-16 ENCOUNTER — Encounter: Payer: Self-pay | Admitting: Podiatry

## 2017-07-16 DIAGNOSIS — M79675 Pain in left toe(s): Secondary | ICD-10-CM

## 2017-07-16 DIAGNOSIS — E1142 Type 2 diabetes mellitus with diabetic polyneuropathy: Secondary | ICD-10-CM

## 2017-07-16 DIAGNOSIS — M79674 Pain in right toe(s): Secondary | ICD-10-CM

## 2017-07-16 DIAGNOSIS — B351 Tinea unguium: Secondary | ICD-10-CM

## 2017-07-16 NOTE — Progress Notes (Signed)
Patient ID: Jacqueline Simon, female   DOB: Sep 11, 1950, 67 y.o.   MRN: [id number] Complaint:  Visit Type: Patient returns to my office for continued preventative foot care services. Complaint: Patient states" my nails have grown long and thick and become painful to walk and wear shoes" Patient has been diagnosed with DM with no foot complications. The patient presents for preventative foot care services. No changes to ROS  Podiatric Exam: Vascular: dorsalis pedis and posterior tibial pulses are palpable bilateral. Capillary return is immediate. Temperature gradient is WNL. Skin turgor WNL  Sensorium: Diminished  Semmes Weinstein monofilament test. Normal tactile sensation bilaterally. Nail Exam: Pt has thick disfigured discolored nails with subungual debris noted bilateral entire nail hallux through fifth toenails Ulcer Exam: There is no evidence of ulcer or pre-ulcerative changes or infection. Orthopedic Exam: Muscle tone and strength are WNL. No limitations in general ROM. No crepitus or effusions noted. Foot type and digits show no abnormalities. Bony prominences are unremarkable. Skin: No Porokeratosis. No infection or ulcers  Diagnosis:  Onychomycosis, , Pain in right toe, pain in left toes  Treatment & Plan Procedures and Treatment: Consent by patient was obtained for treatment procedures. The patient understood the discussion of treatment and procedures well. All questions were answered thoroughly reviewed. Debridement of mycotic and hypertrophic toenails, 1 through 5 bilateral and clearing of subungual debris. No ulceration, no infection noted.  Return Visit-Office Procedure: Patient instructed to return to the office for a follow up visit 10 weeks  for continued evaluation and treatment.  Gardiner Barefoot DPM

## 2017-07-30 DIAGNOSIS — H04213 Epiphora due to excess lacrimation, bilateral lacrimal glands: Secondary | ICD-10-CM | POA: Diagnosis not present

## 2017-07-30 DIAGNOSIS — H1013 Acute atopic conjunctivitis, bilateral: Secondary | ICD-10-CM | POA: Diagnosis not present

## 2017-07-30 DIAGNOSIS — H04123 Dry eye syndrome of bilateral lacrimal glands: Secondary | ICD-10-CM | POA: Diagnosis not present

## 2017-07-30 DIAGNOSIS — H40013 Open angle with borderline findings, low risk, bilateral: Secondary | ICD-10-CM | POA: Diagnosis not present

## 2017-09-16 ENCOUNTER — Ambulatory Visit: Payer: Medicare HMO | Admitting: Podiatry

## 2017-09-17 ENCOUNTER — Encounter: Payer: Self-pay | Admitting: Podiatry

## 2017-09-17 ENCOUNTER — Ambulatory Visit (INDEPENDENT_AMBULATORY_CARE_PROVIDER_SITE_OTHER): Payer: Medicare HMO | Admitting: Podiatry

## 2017-09-17 DIAGNOSIS — M79674 Pain in right toe(s): Secondary | ICD-10-CM

## 2017-09-17 DIAGNOSIS — B351 Tinea unguium: Secondary | ICD-10-CM

## 2017-09-17 DIAGNOSIS — M79675 Pain in left toe(s): Secondary | ICD-10-CM | POA: Diagnosis not present

## 2017-09-17 DIAGNOSIS — E1142 Type 2 diabetes mellitus with diabetic polyneuropathy: Secondary | ICD-10-CM | POA: Diagnosis not present

## 2017-09-17 DIAGNOSIS — M2042 Other hammer toe(s) (acquired), left foot: Secondary | ICD-10-CM

## 2017-09-17 DIAGNOSIS — M2041 Other hammer toe(s) (acquired), right foot: Secondary | ICD-10-CM

## 2017-09-17 NOTE — Progress Notes (Signed)
Patient ID: Jacqueline Simon, female   DOB: 1951/02/25, 68 y.o.   MRN: [id number] Complaint:  Visit Type: Patient returns to my office for continued preventative foot care services. Complaint: Patient states" my nails have grown long and thick and become painful to walk and wear shoes" Patient has been diagnosed with DM with no foot complications. The patient presents for preventative foot care services. No changes to ROS  Podiatric Exam: Vascular: dorsalis pedis and posterior tibial pulses are palpable bilateral. Capillary return is immediate. Temperature gradient is WNL. Skin turgor WNL  Sensorium: Diminished  Semmes Weinstein monofilament test. Normal tactile sensation bilaterally. Nail Exam: Pt has thick disfigured discolored nails with subungual debris noted bilateral entire nail hallux through fifth toenails Ulcer Exam: There is no evidence of ulcer or pre-ulcerative changes or infection. Orthopedic Exam: Muscle tone and strength are WNL. No limitations in general ROM. No crepitus or effusions noted. Foot type and digits show no abnormalities. Bony prominences are unremarkable. Skin: No Porokeratosis. No infection or ulcers  Diagnosis:  Onychomycosis, , Pain in right toe, pain in left toes  Treatment & Plan Procedures and Treatment: Consent by patient was obtained for treatment procedures. The patient understood the discussion of treatment and procedures well. All questions were answered thoroughly reviewed. Debridement of mycotic and hypertrophic toenails, 1 through 5 bilateral and clearing of subungual debris. No ulceration, no infection noted. Patient says she likes her diabetic shoes. Return Visit-Office Procedure: Patient instructed to return to the office for a follow up visit 10 weeks  for continued evaluation and treatment.  Gardiner Barefoot DPM

## 2017-12-02 ENCOUNTER — Other Ambulatory Visit: Payer: Self-pay | Admitting: Family Medicine

## 2017-12-02 DIAGNOSIS — Z1231 Encounter for screening mammogram for malignant neoplasm of breast: Secondary | ICD-10-CM

## 2017-12-17 ENCOUNTER — Encounter: Payer: Self-pay | Admitting: Podiatry

## 2017-12-17 ENCOUNTER — Ambulatory Visit (INDEPENDENT_AMBULATORY_CARE_PROVIDER_SITE_OTHER): Payer: Medicare HMO | Admitting: Podiatry

## 2017-12-17 DIAGNOSIS — E1142 Type 2 diabetes mellitus with diabetic polyneuropathy: Secondary | ICD-10-CM

## 2017-12-17 DIAGNOSIS — B351 Tinea unguium: Secondary | ICD-10-CM | POA: Diagnosis not present

## 2017-12-17 DIAGNOSIS — M79675 Pain in left toe(s): Secondary | ICD-10-CM

## 2017-12-17 DIAGNOSIS — M79674 Pain in right toe(s): Secondary | ICD-10-CM | POA: Diagnosis not present

## 2017-12-17 NOTE — Progress Notes (Signed)
Patient ID: Jacqueline Simon, female   DOB: 12/01/1950, 67 y.o.   MRN: [id number] Complaint:  Visit Type: Patient returns to my office for continued preventative foot care services. Complaint: Patient states" my nails have grown long and thick and become painful to walk and wear shoes" Patient has been diagnosed with DM with no foot complications. The patient presents for preventative foot care services. No changes to ROS  Podiatric Exam: Vascular: dorsalis pedis and posterior tibial pulses are palpable bilateral. Capillary return is immediate. Temperature gradient is WNL. Skin turgor WNL  Sensorium: Diminished  Semmes Weinstein monofilament test. Normal tactile sensation bilaterally. Nail Exam: Pt has thick disfigured discolored nails with subungual debris noted bilateral entire nail hallux through fifth toenails Ulcer Exam: There is no evidence of ulcer or pre-ulcerative changes or infection. Orthopedic Exam: Muscle tone and strength are WNL. No limitations in general ROM. No crepitus or effusions noted. Foot type and digits show no abnormalities. Bony prominences are unremarkable. Skin: No Porokeratosis. No infection or ulcers  Diagnosis:  Onychomycosis, , Pain in right toe, pain in left toes  Treatment & Plan Procedures and Treatment: Consent by patient was obtained for treatment procedures. The patient understood the discussion of treatment and procedures well. All questions were answered thoroughly reviewed. Debridement of mycotic and hypertrophic toenails, 1 through 5 bilateral and clearing of subungual debris. No ulceration, no infection noted. Return Visit-Office Procedure: Patient instructed to return to the office for a follow up visit 10 weeks  for continued evaluation and treatment.  Osher Oettinger DPM 

## 2017-12-29 DIAGNOSIS — Z23 Encounter for immunization: Secondary | ICD-10-CM | POA: Diagnosis not present

## 2017-12-29 DIAGNOSIS — Z720 Tobacco use: Secondary | ICD-10-CM | POA: Diagnosis not present

## 2017-12-29 DIAGNOSIS — J45909 Unspecified asthma, uncomplicated: Secondary | ICD-10-CM | POA: Diagnosis not present

## 2017-12-29 DIAGNOSIS — J309 Allergic rhinitis, unspecified: Secondary | ICD-10-CM | POA: Diagnosis not present

## 2017-12-29 DIAGNOSIS — Z1389 Encounter for screening for other disorder: Secondary | ICD-10-CM | POA: Diagnosis not present

## 2017-12-29 DIAGNOSIS — Z0000 Encounter for general adult medical examination without abnormal findings: Secondary | ICD-10-CM | POA: Diagnosis not present

## 2017-12-29 DIAGNOSIS — E1169 Type 2 diabetes mellitus with other specified complication: Secondary | ICD-10-CM | POA: Diagnosis not present

## 2017-12-29 DIAGNOSIS — I10 Essential (primary) hypertension: Secondary | ICD-10-CM | POA: Diagnosis not present

## 2017-12-29 DIAGNOSIS — E7800 Pure hypercholesterolemia, unspecified: Secondary | ICD-10-CM | POA: Diagnosis not present

## 2017-12-30 ENCOUNTER — Ambulatory Visit: Payer: Self-pay

## 2018-01-28 ENCOUNTER — Ambulatory Visit: Payer: Self-pay

## 2018-02-24 DIAGNOSIS — H04123 Dry eye syndrome of bilateral lacrimal glands: Secondary | ICD-10-CM | POA: Diagnosis not present

## 2018-02-24 DIAGNOSIS — H04213 Epiphora due to excess lacrimation, bilateral lacrimal glands: Secondary | ICD-10-CM | POA: Diagnosis not present

## 2018-02-24 DIAGNOSIS — H40013 Open angle with borderline findings, low risk, bilateral: Secondary | ICD-10-CM | POA: Diagnosis not present

## 2018-02-24 DIAGNOSIS — H35033 Hypertensive retinopathy, bilateral: Secondary | ICD-10-CM | POA: Diagnosis not present

## 2018-02-24 DIAGNOSIS — H1013 Acute atopic conjunctivitis, bilateral: Secondary | ICD-10-CM | POA: Diagnosis not present

## 2018-02-25 ENCOUNTER — Ambulatory Visit: Payer: Medicare HMO | Admitting: Podiatry

## 2018-03-11 ENCOUNTER — Ambulatory Visit: Payer: Self-pay

## 2018-03-27 ENCOUNTER — Ambulatory Visit: Payer: Medicare HMO | Admitting: Podiatry

## 2018-04-07 ENCOUNTER — Ambulatory Visit: Payer: Self-pay

## 2018-04-08 DIAGNOSIS — Z7984 Long term (current) use of oral hypoglycemic drugs: Secondary | ICD-10-CM | POA: Diagnosis not present

## 2018-04-08 DIAGNOSIS — E1165 Type 2 diabetes mellitus with hyperglycemia: Secondary | ICD-10-CM | POA: Diagnosis not present

## 2018-05-05 ENCOUNTER — Ambulatory Visit (INDEPENDENT_AMBULATORY_CARE_PROVIDER_SITE_OTHER): Payer: Medicare HMO | Admitting: Podiatry

## 2018-05-05 ENCOUNTER — Encounter: Payer: Self-pay | Admitting: Podiatry

## 2018-05-05 DIAGNOSIS — M79674 Pain in right toe(s): Secondary | ICD-10-CM

## 2018-05-05 DIAGNOSIS — M79675 Pain in left toe(s): Secondary | ICD-10-CM

## 2018-05-05 DIAGNOSIS — E1142 Type 2 diabetes mellitus with diabetic polyneuropathy: Secondary | ICD-10-CM | POA: Diagnosis not present

## 2018-05-05 DIAGNOSIS — B351 Tinea unguium: Secondary | ICD-10-CM

## 2018-05-05 NOTE — Progress Notes (Signed)
Patient ID: Jacqueline Simon, female   DOB: [birth year], 68 y.o.   MRN: [id number] Complaint:  Visit Type: Patient returns to my office for continued preventative foot care services. Complaint: Patient states" my nails have grown long and thick and become painful to walk and wear shoes" Patient has been diagnosed with DM with no foot complications. The patient presents for preventative foot care services. No changes to ROS  Podiatric Exam: Vascular: dorsalis pedis and posterior tibial pulses are palpable bilateral. Capillary return is immediate. Temperature gradient is WNL. Skin turgor WNL  Sensorium: Diminished  Semmes Weinstein monofilament test. Normal tactile sensation bilaterally. Nail Exam: Pt has thick disfigured discolored nails with subungual debris noted bilateral entire nail hallux through fifth toenails Ulcer Exam: There is no evidence of ulcer or pre-ulcerative changes or infection. Orthopedic Exam: Muscle tone and strength are WNL. No limitations in general ROM. No crepitus or effusions noted. Foot type and digits show no abnormalities. Bony prominences are unremarkable. Skin: No Porokeratosis. No infection or ulcers  Diagnosis:  Onychomycosis, , Pain in right toe, pain in left toes  Treatment & Plan Procedures and Treatment: Consent by patient was obtained for treatment procedures. The patient understood the discussion of treatment and procedures well. All questions were answered thoroughly reviewed. Debridement of mycotic and hypertrophic toenails, 1 through 5 bilateral and clearing of subungual debris. No ulceration, no infection noted. Return Visit-Office Procedure: Patient instructed to return to the office for a follow up visit 10 weeks  for continued evaluation and treatment.  Gardiner Barefoot DPM

## 2018-05-06 ENCOUNTER — Ambulatory Visit
Admission: RE | Admit: 2018-05-06 | Discharge: 2018-05-06 | Disposition: A | Discharge status: Home / Self Care | Payer: Medicare HMO | Source: Ambulatory Visit | Attending: Family Medicine | Admitting: Family Medicine

## 2018-05-06 DIAGNOSIS — Z1231 Encounter for screening mammogram for malignant neoplasm of breast: Secondary | ICD-10-CM

## 2018-07-09 DIAGNOSIS — E1169 Type 2 diabetes mellitus with other specified complication: Secondary | ICD-10-CM | POA: Diagnosis not present

## 2018-08-04 ENCOUNTER — Ambulatory Visit: Payer: Medicare HMO | Admitting: Podiatry

## 2019-01-11 DIAGNOSIS — F17201 Nicotine dependence, unspecified, in remission: Secondary | ICD-10-CM | POA: Diagnosis not present

## 2019-01-11 DIAGNOSIS — I10 Essential (primary) hypertension: Secondary | ICD-10-CM | POA: Diagnosis not present

## 2019-01-11 DIAGNOSIS — J309 Allergic rhinitis, unspecified: Secondary | ICD-10-CM | POA: Diagnosis not present

## 2019-01-11 DIAGNOSIS — E7800 Pure hypercholesterolemia, unspecified: Secondary | ICD-10-CM | POA: Diagnosis not present

## 2019-01-11 DIAGNOSIS — E1169 Type 2 diabetes mellitus with other specified complication: Secondary | ICD-10-CM | POA: Diagnosis not present

## 2019-01-11 DIAGNOSIS — J45909 Unspecified asthma, uncomplicated: Secondary | ICD-10-CM | POA: Diagnosis not present

## 2019-01-21 DIAGNOSIS — E1169 Type 2 diabetes mellitus with other specified complication: Secondary | ICD-10-CM | POA: Diagnosis not present

## 2019-01-21 DIAGNOSIS — E7800 Pure hypercholesterolemia, unspecified: Secondary | ICD-10-CM | POA: Diagnosis not present

## 2019-01-21 DIAGNOSIS — I10 Essential (primary) hypertension: Secondary | ICD-10-CM | POA: Diagnosis not present

## 2019-01-21 DIAGNOSIS — Z23 Encounter for immunization: Secondary | ICD-10-CM | POA: Diagnosis not present

## 2019-02-03 ENCOUNTER — Ambulatory Visit: Payer: Medicare HMO | Admitting: Podiatry

## 2019-04-13 ENCOUNTER — Other Ambulatory Visit: Payer: Self-pay

## 2019-04-13 ENCOUNTER — Encounter: Payer: Self-pay | Admitting: Podiatry

## 2019-04-13 ENCOUNTER — Ambulatory Visit (INDEPENDENT_AMBULATORY_CARE_PROVIDER_SITE_OTHER): Payer: Medicare HMO | Admitting: Podiatry

## 2019-04-13 DIAGNOSIS — M79675 Pain in left toe(s): Secondary | ICD-10-CM

## 2019-04-13 DIAGNOSIS — M79674 Pain in right toe(s): Secondary | ICD-10-CM | POA: Diagnosis not present

## 2019-04-13 DIAGNOSIS — E1142 Type 2 diabetes mellitus with diabetic polyneuropathy: Secondary | ICD-10-CM | POA: Diagnosis not present

## 2019-04-13 DIAGNOSIS — B351 Tinea unguium: Secondary | ICD-10-CM

## 2019-04-13 NOTE — Progress Notes (Signed)
Patient ID: Jacqueline Simon, female   DOB: [birth year], 69 y.o.   MRN: [id number] Complaint:  Visit Type: Patient returns to my office for continued preventative foot care services. Complaint: Patient states" my nails have grown long and thick and become painful to walk and wear shoes" Patient has been diagnosed with DM with no foot complications. The patient presents for preventative foot care services. No changes to ROS  Podiatric Exam: Vascular: dorsalis pedis and posterior tibial pulses are palpable bilateral. Capillary return is immediate. Temperature gradient is WNL. Skin turgor WNL  Sensorium: Diminished  Semmes Weinstein monofilament test. Normal tactile sensation bilaterally. Nail Exam: Pt has thick disfigured discolored nails with subungual debris noted bilateral entire nail hallux through fifth toenails Ulcer Exam: There is no evidence of ulcer or pre-ulcerative changes or infection. Orthopedic Exam: Muscle tone and strength are WNL. No limitations in general ROM. No crepitus or effusions noted. Foot type and digits show no abnormalities. Bony prominences are unremarkable. Skin: No Porokeratosis. No infection or ulcers  Diagnosis:  Onychomycosis, , Pain in right toe, pain in left toes  Treatment & Plan Procedures and Treatment: Consent by patient was obtained for treatment procedures. The patient understood the discussion of treatment and procedures well. All questions were answered thoroughly reviewed. Debridement of mycotic and hypertrophic toenails, 1 through 5 bilateral and clearing of subungual debris. No ulceration, no infection noted. Return Visit-Office Procedure: Patient instructed to return to the office for a follow up visit 10 weeks  for continued evaluation and treatment.  Gardiner Barefoot DPM

## 2019-04-26 ENCOUNTER — Other Ambulatory Visit: Payer: Self-pay | Admitting: Family Medicine

## 2019-04-26 DIAGNOSIS — Z1231 Encounter for screening mammogram for malignant neoplasm of breast: Secondary | ICD-10-CM

## 2019-05-31 ENCOUNTER — Ambulatory Visit: Payer: Medicare HMO

## 2019-06-28 ENCOUNTER — Other Ambulatory Visit: Payer: Self-pay

## 2019-06-28 ENCOUNTER — Ambulatory Visit
Admission: RE | Admit: 2019-06-28 | Discharge: 2019-06-28 | Disposition: A | Discharge status: Home / Self Care | Payer: Medicare HMO | Source: Ambulatory Visit | Attending: Family Medicine | Admitting: Family Medicine

## 2019-06-28 DIAGNOSIS — Z1231 Encounter for screening mammogram for malignant neoplasm of breast: Secondary | ICD-10-CM | POA: Diagnosis not present

## 2019-07-13 ENCOUNTER — Ambulatory Visit: Payer: Medicare HMO | Admitting: Podiatry

## 2019-08-19 DIAGNOSIS — J45909 Unspecified asthma, uncomplicated: Secondary | ICD-10-CM | POA: Diagnosis not present

## 2019-08-19 DIAGNOSIS — Z0000 Encounter for general adult medical examination without abnormal findings: Secondary | ICD-10-CM | POA: Diagnosis not present

## 2019-08-19 DIAGNOSIS — F17201 Nicotine dependence, unspecified, in remission: Secondary | ICD-10-CM | POA: Diagnosis not present

## 2019-08-19 DIAGNOSIS — J309 Allergic rhinitis, unspecified: Secondary | ICD-10-CM | POA: Diagnosis not present

## 2019-08-19 DIAGNOSIS — E1169 Type 2 diabetes mellitus with other specified complication: Secondary | ICD-10-CM | POA: Diagnosis not present

## 2019-08-19 DIAGNOSIS — Z1389 Encounter for screening for other disorder: Secondary | ICD-10-CM | POA: Diagnosis not present

## 2019-08-19 DIAGNOSIS — E7800 Pure hypercholesterolemia, unspecified: Secondary | ICD-10-CM | POA: Diagnosis not present

## 2019-08-19 DIAGNOSIS — I10 Essential (primary) hypertension: Secondary | ICD-10-CM | POA: Diagnosis not present

## 2019-08-19 DIAGNOSIS — R609 Edema, unspecified: Secondary | ICD-10-CM | POA: Diagnosis not present

## 2019-11-02 ENCOUNTER — Ambulatory Visit: Payer: Medicare HMO | Admitting: Podiatry

## 2019-11-09 ENCOUNTER — Encounter: Payer: Self-pay | Admitting: Podiatry

## 2019-11-09 ENCOUNTER — Ambulatory Visit (INDEPENDENT_AMBULATORY_CARE_PROVIDER_SITE_OTHER): Payer: Medicare HMO | Admitting: Podiatry

## 2019-11-09 ENCOUNTER — Other Ambulatory Visit: Payer: Self-pay

## 2019-11-09 DIAGNOSIS — M79675 Pain in left toe(s): Secondary | ICD-10-CM

## 2019-11-09 DIAGNOSIS — M79674 Pain in right toe(s): Secondary | ICD-10-CM | POA: Diagnosis not present

## 2019-11-09 DIAGNOSIS — E1142 Type 2 diabetes mellitus with diabetic polyneuropathy: Secondary | ICD-10-CM

## 2019-11-09 DIAGNOSIS — B351 Tinea unguium: Secondary | ICD-10-CM

## 2019-11-09 NOTE — Progress Notes (Signed)
This patient returns to my office for at risk foot care.  This patient requires this care by a professional since this patient will be at risk due to having diabetes.  This patient is unable to cut nails herself since the patient cannot reach her nails.These nails are painful walking and wearing shoes.  This patient presents for at risk foot care today.  General Appearance  Alert, conversant and in no acute stress.  Vascular  Dorsalis pedis and posterior tibial  pulses are palpable  bilaterally.  Capillary return is within normal limits  bilaterally. Temperature is within normal limits  bilaterally.  Neurologic  Senn-Weinstein monofilament wire test diminished   bilaterally. Muscle power within normal limits bilaterally.  Nails Thick disfigured discolored nails with subungual debris  from hallux to fifth toes bilaterally.  Pincer nails. No evidence of bacterial infection or drainage bilaterally.  Orthopedic  No limitations of motion  feet .  No crepitus or effusions noted.  No bony pathology or digital deformities noted.  Skin  normotropic skin with no porokeratosis noted bilaterally.  No signs of infections or ulcers noted.     Onychomycosis  Pain in right toes  Pain in left toes  Consent was obtained for treatment procedures.   Mechanical debridement of nails 1-5  bilaterally performed with a nail nipper.  Filed with dremel without incident.    Return office visit    3 months                  Told patient to return for periodic foot care and evaluation due to potential at risk complications.   Cooper Stamp DPM  

## 2020-02-09 ENCOUNTER — Encounter: Payer: Self-pay | Admitting: Podiatry

## 2020-02-09 ENCOUNTER — Ambulatory Visit (INDEPENDENT_AMBULATORY_CARE_PROVIDER_SITE_OTHER): Payer: Medicare HMO | Admitting: Podiatry

## 2020-02-09 ENCOUNTER — Other Ambulatory Visit: Payer: Self-pay

## 2020-02-09 DIAGNOSIS — M79675 Pain in left toe(s): Secondary | ICD-10-CM | POA: Diagnosis not present

## 2020-02-09 DIAGNOSIS — M79674 Pain in right toe(s): Secondary | ICD-10-CM | POA: Diagnosis not present

## 2020-02-09 DIAGNOSIS — E1142 Type 2 diabetes mellitus with diabetic polyneuropathy: Secondary | ICD-10-CM | POA: Diagnosis not present

## 2020-02-09 DIAGNOSIS — B351 Tinea unguium: Secondary | ICD-10-CM

## 2020-02-09 NOTE — Progress Notes (Signed)
This patient returns to my office for at risk foot care.  This patient requires this care by a professional since this patient will be at risk due to having diabetes.  This patient is unable to cut nails herself since the patient cannot reach her nails.These nails are painful walking and wearing shoes.  This patient presents for at risk foot care today.  General Appearance  Alert, conversant and in no acute stress.  Vascular  Dorsalis pedis and posterior tibial  pulses are palpable  bilaterally.  Capillary return is within normal limits  bilaterally. Temperature is within normal limits  bilaterally.  Neurologic  Senn-Weinstein monofilament wire test diminished   bilaterally. Muscle power within normal limits bilaterally.  Nails Thick disfigured discolored nails with subungual debris  from hallux to fifth toes bilaterally.  Pincer nails. No evidence of bacterial infection or drainage bilaterally.  Orthopedic  No limitations of motion  feet .  No crepitus or effusions noted.  No bony pathology or digital deformities noted.  Skin  normotropic skin with no porokeratosis noted bilaterally.  No signs of infections or ulcers noted.     Onychomycosis  Pain in right toes  Pain in left toes  Consent was obtained for treatment procedures.   Mechanical debridement of nails 1-5  bilaterally performed with a nail nipper.  Filed with dremel without incident.    Return office visit    3 months                  Told patient to return for periodic foot care and evaluation due to potential at risk complications.   Keyarah Mcroy DPM  

## 2020-04-07 DIAGNOSIS — I10 Essential (primary) hypertension: Secondary | ICD-10-CM | POA: Diagnosis not present

## 2020-04-07 DIAGNOSIS — J45909 Unspecified asthma, uncomplicated: Secondary | ICD-10-CM | POA: Diagnosis not present

## 2020-04-07 DIAGNOSIS — E1169 Type 2 diabetes mellitus with other specified complication: Secondary | ICD-10-CM | POA: Diagnosis not present

## 2020-04-07 DIAGNOSIS — E7800 Pure hypercholesterolemia, unspecified: Secondary | ICD-10-CM | POA: Diagnosis not present

## 2020-05-16 ENCOUNTER — Other Ambulatory Visit: Payer: Self-pay

## 2020-05-16 ENCOUNTER — Ambulatory Visit (INDEPENDENT_AMBULATORY_CARE_PROVIDER_SITE_OTHER): Payer: Medicare HMO | Admitting: Podiatry

## 2020-05-16 ENCOUNTER — Encounter: Payer: Self-pay | Admitting: Podiatry

## 2020-05-16 DIAGNOSIS — M79674 Pain in right toe(s): Secondary | ICD-10-CM

## 2020-05-16 DIAGNOSIS — B351 Tinea unguium: Secondary | ICD-10-CM | POA: Diagnosis not present

## 2020-05-16 DIAGNOSIS — E1142 Type 2 diabetes mellitus with diabetic polyneuropathy: Secondary | ICD-10-CM | POA: Diagnosis not present

## 2020-05-16 DIAGNOSIS — M79675 Pain in left toe(s): Secondary | ICD-10-CM | POA: Diagnosis not present

## 2020-05-16 NOTE — Progress Notes (Signed)
This patient returns to my office for at risk foot care.  This patient requires this care by a professional since this patient will be at risk due to having diabetes.  This patient is unable to cut nails herself since the patient cannot reach her nails.These nails are painful walking and wearing shoes.  This patient presents for at risk foot care today.  General Appearance  Alert, conversant and in no acute stress.  Vascular  Dorsalis pedis and posterior tibial  pulses are palpable  bilaterally.  Capillary return is within normal limits  bilaterally. Temperature is within normal limits  bilaterally.  Neurologic  Senn-Weinstein monofilament wire test diminished   bilaterally. Muscle power within normal limits bilaterally.  Nails Thick disfigured discolored nails with subungual debris  from hallux to fifth toes bilaterally.  Pincer nails. No evidence of bacterial infection or drainage bilaterally.  Orthopedic  No limitations of motion  feet .  No crepitus or effusions noted.  No bony pathology or digital deformities noted.  Skin  normotropic skin with no porokeratosis noted bilaterally.  No signs of infections or ulcers noted.     Onychomycosis  Pain in right toes  Pain in left toes  Consent was obtained for treatment procedures.   Mechanical debridement of nails 1-5  bilaterally performed with a nail nipper.  Filed with dremel without incident.    Return office visit    3 months                  Told patient to return for periodic foot care and evaluation due to potential at risk complications.   Gardiner Barefoot DPM

## 2020-06-02 ENCOUNTER — Other Ambulatory Visit: Payer: Self-pay | Admitting: Family Medicine

## 2020-06-02 DIAGNOSIS — Z1231 Encounter for screening mammogram for malignant neoplasm of breast: Secondary | ICD-10-CM

## 2020-07-26 ENCOUNTER — Inpatient Hospital Stay: Admission: RE | Admit: 2020-07-26 | Payer: Medicare HMO | Source: Ambulatory Visit

## 2020-08-16 ENCOUNTER — Encounter: Payer: Self-pay | Admitting: Podiatry

## 2020-08-16 ENCOUNTER — Ambulatory Visit (INDEPENDENT_AMBULATORY_CARE_PROVIDER_SITE_OTHER): Payer: Medicare HMO | Admitting: Podiatry

## 2020-08-16 ENCOUNTER — Other Ambulatory Visit: Payer: Self-pay

## 2020-08-16 DIAGNOSIS — Z86010 Personal history of colonic polyps: Secondary | ICD-10-CM | POA: Insufficient documentation

## 2020-08-16 DIAGNOSIS — I10 Essential (primary) hypertension: Secondary | ICD-10-CM | POA: Insufficient documentation

## 2020-08-16 DIAGNOSIS — M79674 Pain in right toe(s): Secondary | ICD-10-CM | POA: Diagnosis not present

## 2020-08-16 DIAGNOSIS — E1142 Type 2 diabetes mellitus with diabetic polyneuropathy: Secondary | ICD-10-CM

## 2020-08-16 DIAGNOSIS — B351 Tinea unguium: Secondary | ICD-10-CM | POA: Diagnosis not present

## 2020-08-16 DIAGNOSIS — F17201 Nicotine dependence, unspecified, in remission: Secondary | ICD-10-CM | POA: Insufficient documentation

## 2020-08-16 DIAGNOSIS — M79675 Pain in left toe(s): Secondary | ICD-10-CM | POA: Diagnosis not present

## 2020-08-16 DIAGNOSIS — E1169 Type 2 diabetes mellitus with other specified complication: Secondary | ICD-10-CM | POA: Insufficient documentation

## 2020-08-16 DIAGNOSIS — J45909 Unspecified asthma, uncomplicated: Secondary | ICD-10-CM | POA: Insufficient documentation

## 2020-08-16 DIAGNOSIS — J309 Allergic rhinitis, unspecified: Secondary | ICD-10-CM | POA: Insufficient documentation

## 2020-08-16 DIAGNOSIS — E7800 Pure hypercholesterolemia, unspecified: Secondary | ICD-10-CM | POA: Insufficient documentation

## 2020-08-16 NOTE — Progress Notes (Addendum)
This patient returns to my office for at risk foot care.  This patient requires this care by a professional since this patient will be at risk due to having diabetes.  This patient is unable to cut nails herself since the patient cannot reach her nails.These nails are painful walking and wearing shoes.  This patient presents for at risk foot care today.  General Appearance  Alert, conversant and in no acute stress.  Vascular  Dorsalis pedis and posterior tibial  pulses are weakly palpable  bilaterally.  Capillary return is within normal limits  Bilaterally.Cold feet  B/L.  bilaterally.  Neurologic  Senn-Weinstein monofilament wire test diminished   bilaterally. Muscle power within normal limits bilaterally.  Nails Thick disfigured discolored nails with subungual debris  from hallux to fifth toes bilaterally.  Pincer nails. No evidence of bacterial infection or drainage bilaterally.  Orthopedic  No limitations of motion  feet .  No crepitus or effusions noted.  No bony pathology or digital deformities noted.  Skin  normotropic skin with no porokeratosis noted bilaterally.  No signs of infections or ulcers noted.     Onychomycosis  Pain in right toes  Pain in left toes  Consent was obtained for treatment procedures.   Mechanical debridement of nails 1-5  bilaterally performed with a nail nipper.  Filed with dremel without incident.  Patient has swelling on dorsum of both feet.  Told her to wear anklet on her feet to see if the swelling and pain subsides.  She says she has anklet at home.   Return office visit    3 months                  Told patient to return for periodic foot care and evaluation due to potential at risk complications.   Gardiner Barefoot DPM

## 2020-08-17 ENCOUNTER — Encounter: Payer: Self-pay | Admitting: Podiatry

## 2020-09-14 ENCOUNTER — Ambulatory Visit: Payer: Medicare HMO

## 2020-09-20 ENCOUNTER — Emergency Department (HOSPITAL_COMMUNITY): Payer: Medicare HMO

## 2020-09-20 ENCOUNTER — Encounter (HOSPITAL_COMMUNITY): Payer: Self-pay

## 2020-09-20 ENCOUNTER — Ambulatory Visit (HOSPITAL_COMMUNITY)
Admission: EM | Admit: 2020-09-20 | Discharge: 2020-09-20 | Disposition: A | Discharge status: Home / Self Care | Payer: Medicare HMO | Attending: Student | Admitting: Student

## 2020-09-20 ENCOUNTER — Other Ambulatory Visit: Payer: Self-pay

## 2020-09-20 ENCOUNTER — Inpatient Hospital Stay (HOSPITAL_COMMUNITY)
Admission: EM | Admit: 2020-09-20 | Discharge: 2020-09-22 | DRG: 242 | Disposition: A | Discharge status: Home / Self Care | Payer: Medicare HMO | Source: Ambulatory Visit | Attending: Cardiology | Admitting: Cardiology

## 2020-09-20 ENCOUNTER — Encounter (HOSPITAL_COMMUNITY): Payer: Self-pay | Admitting: *Deleted

## 2020-09-20 DIAGNOSIS — J45909 Unspecified asthma, uncomplicated: Secondary | ICD-10-CM | POA: Diagnosis present

## 2020-09-20 DIAGNOSIS — R609 Edema, unspecified: Secondary | ICD-10-CM | POA: Diagnosis not present

## 2020-09-20 DIAGNOSIS — I10 Essential (primary) hypertension: Secondary | ICD-10-CM

## 2020-09-20 DIAGNOSIS — E876 Hypokalemia: Secondary | ICD-10-CM | POA: Diagnosis not present

## 2020-09-20 DIAGNOSIS — E785 Hyperlipidemia, unspecified: Secondary | ICD-10-CM | POA: Diagnosis present

## 2020-09-20 DIAGNOSIS — Z7951 Long term (current) use of inhaled steroids: Secondary | ICD-10-CM

## 2020-09-20 DIAGNOSIS — I442 Atrioventricular block, complete: Secondary | ICD-10-CM | POA: Diagnosis not present

## 2020-09-20 DIAGNOSIS — R079 Chest pain, unspecified: Secondary | ICD-10-CM | POA: Diagnosis not present

## 2020-09-20 DIAGNOSIS — Z880 Allergy status to penicillin: Secondary | ICD-10-CM

## 2020-09-20 DIAGNOSIS — R001 Bradycardia, unspecified: Secondary | ICD-10-CM | POA: Diagnosis present

## 2020-09-20 DIAGNOSIS — Z888 Allergy status to other drugs, medicaments and biological substances status: Secondary | ICD-10-CM | POA: Diagnosis not present

## 2020-09-20 DIAGNOSIS — Z86010 Personal history of colonic polyps: Secondary | ICD-10-CM

## 2020-09-20 DIAGNOSIS — I499 Cardiac arrhythmia, unspecified: Secondary | ICD-10-CM | POA: Diagnosis not present

## 2020-09-20 DIAGNOSIS — Z713 Dietary counseling and surveillance: Secondary | ICD-10-CM | POA: Diagnosis not present

## 2020-09-20 DIAGNOSIS — Z7982 Long term (current) use of aspirin: Secondary | ICD-10-CM

## 2020-09-20 DIAGNOSIS — Z87891 Personal history of nicotine dependence: Secondary | ICD-10-CM | POA: Diagnosis not present

## 2020-09-20 DIAGNOSIS — Z7984 Long term (current) use of oral hypoglycemic drugs: Secondary | ICD-10-CM | POA: Diagnosis not present

## 2020-09-20 DIAGNOSIS — I459 Conduction disorder, unspecified: Secondary | ICD-10-CM

## 2020-09-20 DIAGNOSIS — R778 Other specified abnormalities of plasma proteins: Secondary | ICD-10-CM | POA: Diagnosis present

## 2020-09-20 DIAGNOSIS — Z6841 Body Mass Index (BMI) 40.0 and over, adult: Secondary | ICD-10-CM | POA: Diagnosis not present

## 2020-09-20 DIAGNOSIS — I959 Hypotension, unspecified: Secondary | ICD-10-CM | POA: Diagnosis not present

## 2020-09-20 DIAGNOSIS — Z79899 Other long term (current) drug therapy: Secondary | ICD-10-CM | POA: Diagnosis not present

## 2020-09-20 DIAGNOSIS — R0600 Dyspnea, unspecified: Secondary | ICD-10-CM | POA: Diagnosis not present

## 2020-09-20 DIAGNOSIS — U071 COVID-19: Secondary | ICD-10-CM | POA: Diagnosis not present

## 2020-09-20 DIAGNOSIS — E1169 Type 2 diabetes mellitus with other specified complication: Secondary | ICD-10-CM | POA: Diagnosis present

## 2020-09-20 DIAGNOSIS — Z95818 Presence of other cardiac implants and grafts: Secondary | ICD-10-CM

## 2020-09-20 DIAGNOSIS — I517 Cardiomegaly: Secondary | ICD-10-CM | POA: Diagnosis not present

## 2020-09-20 DIAGNOSIS — Z885 Allergy status to narcotic agent status: Secondary | ICD-10-CM | POA: Diagnosis not present

## 2020-09-20 DIAGNOSIS — K068 Other specified disorders of gingiva and edentulous alveolar ridge: Secondary | ICD-10-CM | POA: Diagnosis present

## 2020-09-20 HISTORY — DX: Pure hypercholesterolemia, unspecified: E78.00

## 2020-09-20 HISTORY — DX: Unspecified asthma, uncomplicated: J45.909

## 2020-09-20 LAB — CBC WITH DIFFERENTIAL/PLATELET
Abs Immature Granulocytes: 0.03 10*3/uL (ref 0.00–0.07)
Basophils Absolute: 0.1 10*3/uL (ref 0.0–0.1)
Basophils Relative: 1 %
Eosinophils Absolute: 0.2 10*3/uL (ref 0.0–0.5)
Eosinophils Relative: 2 %
HCT: 39.6 % (ref 36.0–46.0)
Hemoglobin: 12 g/dL (ref 12.0–15.0)
Immature Granulocytes: 0 %
Lymphocytes Relative: 28 %
Lymphs Abs: 2.8 10*3/uL (ref 0.7–4.0)
MCH: 29.7 pg (ref 26.0–34.0)
MCHC: 30.3 g/dL (ref 30.0–36.0)
MCV: 98 fL (ref 80.0–100.0)
Monocytes Absolute: 0.8 10*3/uL (ref 0.1–1.0)
Monocytes Relative: 8 %
Neutro Abs: 6.1 10*3/uL (ref 1.7–7.7)
Neutrophils Relative %: 61 %
Platelets: 319 10*3/uL (ref 150–400)
RBC: 4.04 MIL/uL (ref 3.87–5.11)
RDW: 13.7 % (ref 11.5–15.5)
WBC: 10 10*3/uL (ref 4.0–10.5)
nRBC: 0 % (ref 0.0–0.2)

## 2020-09-20 LAB — TROPONIN I (HIGH SENSITIVITY)
Troponin I (High Sensitivity): 36 ng/L — ABNORMAL HIGH (ref <18)
Troponin I (High Sensitivity): 42 ng/L — ABNORMAL HIGH (ref <18)

## 2020-09-20 LAB — BASIC METABOLIC PANEL
Anion gap: 6 (ref 5–15)
BUN: 14 mg/dL (ref 8–23)
CO2: 29 mmol/L (ref 22–32)
Calcium: 9.1 mg/dL (ref 8.9–10.3)
Chloride: 105 mmol/L (ref 98–111)
Creatinine, Ser: 0.75 mg/dL (ref 0.44–1.00)
GFR, Estimated: >60 mL/min (ref >60)
Glucose, Bld: 102 mg/dL — ABNORMAL HIGH (ref 70–99)
Potassium: 3.8 mmol/L (ref 3.5–5.1)
Sodium: 140 mmol/L (ref 135–145)

## 2020-09-20 LAB — MAGNESIUM: Magnesium: 1.9 mg/dL (ref 1.7–2.4)

## 2020-09-20 LAB — GLUCOSE, CAPILLARY: Glucose-Capillary: 107 mg/dL — ABNORMAL HIGH (ref 70–99)

## 2020-09-20 LAB — RESP PANEL BY RT-PCR (FLU A&B, COVID) ARPGX2
Influenza A by PCR: NEGATIVE
Influenza B by PCR: NEGATIVE
SARS Coronavirus 2 by RT PCR: POSITIVE — AB

## 2020-09-20 MED ORDER — ENOXAPARIN SODIUM 40 MG/0.4ML IJ SOSY
40.0000 mg | PREFILLED_SYRINGE | INTRAMUSCULAR | Status: DC
  Administered 2020-09-20 – 2020-09-21 (×2): 40 mg via SUBCUTANEOUS
  Filled 2020-09-20 (×2): qty 0.4

## 2020-09-20 MED ORDER — ASCORBIC ACID 500 MG PO TABS
500.0000 mg | ORAL_TABLET | Freq: Every day | ORAL | Status: DC
  Administered 2020-09-21 – 2020-09-22 (×2): 500 mg via ORAL
  Filled 2020-09-20 (×2): qty 1

## 2020-09-20 MED ORDER — HYDRALAZINE HCL 10 MG PO TABS: 10.0000 mg | ORAL_TABLET | Freq: Three times a day (TID) | ORAL | Status: DC

## 2020-09-20 MED ORDER — GABAPENTIN 100 MG PO CAPS
100.0000 mg | ORAL_CAPSULE | Freq: Two times a day (BID) | ORAL | Status: DC
  Administered 2020-09-20 – 2020-09-22 (×4): 100 mg via ORAL
  Filled 2020-09-20 (×4): qty 1

## 2020-09-20 MED ORDER — ALBUTEROL SULFATE HFA 108 (90 BASE) MCG/ACT IN AERS
1.0000 | INHALATION_SPRAY | RESPIRATORY_TRACT | Status: DC | PRN
Start: 2020-09-20 — End: 2020-09-22

## 2020-09-20 MED ORDER — ACETAMINOPHEN 325 MG PO TABS
650.0000 mg | ORAL_TABLET | Freq: Four times a day (QID) | ORAL | Status: DC | PRN
  Administered 2020-09-21: 650 mg via ORAL
  Filled 2020-09-20 (×2): qty 2

## 2020-09-20 MED ORDER — HYDRALAZINE HCL 10 MG PO TABS
10.0000 mg | ORAL_TABLET | Freq: Four times a day (QID) | ORAL | Status: DC | PRN
  Administered 2020-09-21 (×2): 10 mg via ORAL
  Filled 2020-09-20 (×3): qty 1

## 2020-09-20 MED ORDER — IOHEXOL 350 MG/ML SOLN
100.0000 mL | Freq: Once | INTRAVENOUS | Status: AC | PRN
  Administered 2020-09-20: 100 mL via INTRAVENOUS

## 2020-09-20 MED ORDER — ZINC SULFATE 220 (50 ZN) MG PO CAPS
220.0000 mg | ORAL_CAPSULE | Freq: Every day | ORAL | Status: DC
  Administered 2020-09-21 – 2020-09-22 (×3): 220 mg via ORAL
  Filled 2020-09-20 (×3): qty 1

## 2020-09-20 MED ORDER — FLUTICASONE PROPIONATE HFA 220 MCG/ACT IN AERO
1.0000 | INHALATION_SPRAY | Freq: Two times a day (BID) | RESPIRATORY_TRACT | Status: DC
  Administered 2020-09-21 (×3): 1 via RESPIRATORY_TRACT
  Filled 2020-09-20: qty 12

## 2020-09-20 MED ORDER — ROSUVASTATIN CALCIUM 5 MG PO TABS
10.0000 mg | ORAL_TABLET | Freq: Every day | ORAL | Status: DC
  Administered 2020-09-20 – 2020-09-22 (×3): 10 mg via ORAL
  Filled 2020-09-20 (×3): qty 2

## 2020-09-20 MED ORDER — IPRATROPIUM-ALBUTEROL 20-100 MCG/ACT IN AERS: 1.0000 | INHALATION_SPRAY | Freq: Four times a day (QID) | RESPIRATORY_TRACT | Status: DC | PRN

## 2020-09-20 MED ORDER — THIAMINE HCL 100 MG PO TABS
100.0000 mg | ORAL_TABLET | Freq: Every day | ORAL | Status: DC
  Administered 2020-09-20 – 2020-09-22 (×3): 100 mg via ORAL
  Filled 2020-09-20 (×3): qty 1

## 2020-09-20 MED ORDER — FOLIC ACID 1 MG PO TABS
1.0000 mg | ORAL_TABLET | Freq: Every day | ORAL | Status: DC
  Administered 2020-09-20 – 2020-09-22 (×3): 1 mg via ORAL
  Filled 2020-09-20 (×3): qty 1

## 2020-09-20 MED ORDER — INSULIN ASPART 100 UNIT/ML IJ SOLN: 0.0000 [IU] | Freq: Every day | INTRAMUSCULAR | Status: DC

## 2020-09-20 MED ORDER — INSULIN ASPART 100 UNIT/ML IJ SOLN
0.0000 [IU] | Freq: Three times a day (TID) | INTRAMUSCULAR | Status: DC
  Administered 2020-09-22 (×2): 1 [IU] via SUBCUTANEOUS

## 2020-09-20 MED ORDER — ADULT MULTIVITAMIN W/MINERALS CH
1.0000 | ORAL_TABLET | Freq: Every day | ORAL | Status: DC
  Administered 2020-09-20 – 2020-09-22 (×3): 1 via ORAL
  Filled 2020-09-20 (×3): qty 1

## 2020-09-20 MED ORDER — GUAIFENESIN-DM 100-10 MG/5ML PO SYRP: 10.0000 mL | ORAL_SOLUTION | ORAL | Status: DC | PRN

## 2020-09-20 NOTE — Discharge Instructions (Signed)
Discharged to ED via EMS

## 2020-09-20 NOTE — ED Provider Notes (Signed)
[facility] EMERGENCY DEPARTMENT Provider Note   CSN: [id number] Arrival date & time: 09/20/20  1556     History Chief Complaint  Patient presents with   Bradycardia    Jacqueline Simon is a 70 y.o. female with a history of diabetes, obesity, hypertension, hyperlipidemia, presenting to the emergency department with bradycardia.  The patient reports that she went to urgent care today because she had been having lower extremity foot swelling and bruising for several weeks.  While at urgent care she was noted to be in heart block on her EKG.  She was advised to come to the ED for evaluation.  The patient denies lightheadedness, chest pain, palpitations, nausea, vomiting.  She denies any recent fevers or chills.  She does report that she has noted some blood when coughing for the past several days.  She is not sure whether this is coming from a bleeding spot in her gums or from deeper in her lungs.  This never happened her before.  She is not actively smoker.  She denies any history of MI, congestive heart failure, or other cardiac disease.  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 No pertinent surgical history.   OB History   No obstetric history on file.     Family History  Problem Relation Age of Onset   Breast cancer Mother    Breast cancer Daughter     Social History   Tobacco Use   Smoking status:  Former    Years: 3.00    Pack years: 0.00    Types: Cigarettes   Smokeless tobacco: Never  Vaping Use   Vaping Use: Never used  Substance Use Topics   Alcohol use: No   Drug use: No    Home Medications Prior to Admission medications   Medication Sig Start Date End Date Taking? Authorizing Provider  albuterol (PROVENTIL HFA;VENTOLIN HFA) 108 (90 BASE) MCG/ACT inhaler Inhale 1-2 puffs into the lungs every 4 (four) hours as needed for wheezing or shortness of breath.   Yes [provider]  aspirin EC 81 MG tablet Take 81 mg by mouth daily.   Yes [provider]  cetirizine (ZYRTEC) 10 MG tablet Take 10 mg by mouth daily.   Yes [provider]  fluticasone (FLOVENT HFA) 220 MCG/ACT inhaler Inhale 1 puff into the lungs in the morning and at bedtime.   Yes [provider]  glimepiride (AMARYL) 1 MG tablet Take 1 mg by mouth daily with breakfast. 04/09/18  Yes [provider]  losartan-hydrochlorothiazide (HYZAAR) 100-25 MG tablet Take 1 tablet by mouth daily. 01/10/19  Yes [provider]  metFORMIN (GLUCOPHAGE-XR) 500 MG 24 hr tablet Take 1,000 mg by mouth 2 (two) times daily.  01/17/14  Yes [provider]  rosuvastatin (CRESTOR) 10 MG tablet Take 10 mg by mouth daily. 07/14/20  Yes [provider]    Allergies  Lisinopril-hydrochlorothiazide, Tramadol, and Penicillins  Review of Systems   Review of Systems  Constitutional:  Negative for chills and fever.  Eyes:  Negative for pain and visual disturbance.  Respiratory:  Negative for cough and shortness of breath.   Cardiovascular:  Positive for leg swelling. Negative for chest pain and palpitations.  Gastrointestinal:  Negative for abdominal pain and vomiting.  Genitourinary:  Negative for dysuria and hematuria.  Musculoskeletal:  Negative for arthralgias and back pain.  Skin:  Positive for rash. Negative for color change.  Neurological:  Negative for syncope,  light-headedness and headaches.  All other systems reviewed and are negative.  Physical Exam Updated Vital Signs BP (!) 198/69   Pulse (!) 36   Temp 98.3 F (36.8 C) (Temporal)   Resp 18   Ht 5\' 3"  (1.6 m)   Wt 93 kg   SpO2 95%   BMI 36.31 kg/m   Physical Exam Constitutional:      General: She is not in acute distress.    Appearance: She is obese.  HENT:     Head: Normocephalic and atraumatic.  Eyes:     Conjunctiva/sclera: Conjunctivae normal.     Pupils: Pupils are equal, round, and reactive to light.  Cardiovascular:     Rate and Rhythm: Normal rate and regular rhythm.     Comments: HR 30-40 bpm Pulmonary:     Effort: Pulmonary effort is normal. No respiratory distress.  Abdominal:     General: There is no distension.     Tenderness: There is no abdominal tenderness.  Skin:    General: Skin is warm and dry.  Neurological:     General: No focal deficit present.     Mental Status: She is alert. Mental status is at baseline.  Psychiatric:        Mood and Affect: Mood normal.        Behavior: Behavior normal.    ED Results / Procedures / Treatments   Labs (all labs ordered are listed, but only abnormal results are displayed) Labs Reviewed  RESP PANEL BY RT-PCR (FLU A&B, COVID) ARPGX2 - Abnormal; Notable for the following components:      Result Value   SARS Coronavirus 2 by RT PCR POSITIVE (*)    All other components within normal limits  BASIC METABOLIC PANEL - Abnormal; Notable for the following components:   Glucose, Bld 102 (*)    All other components within normal limits  TROPONIN I (HIGH SENSITIVITY) - Abnormal; Notable for the following components:   Troponin I (High Sensitivity) 36 (*)    All other components within normal limits  TROPONIN I (HIGH SENSITIVITY) - Abnormal; Notable for the following components:   Troponin I (High Sensitivity) 42 (*)    All other components within normal limits  CBC WITH DIFFERENTIAL/PLATELET  MAGNESIUM  CBC WITH  DIFFERENTIAL/PLATELET  COMPREHENSIVE METABOLIC PANEL  C-REACTIVE PROTEIN  D-DIMER, QUANTITATIVE  FERRITIN  MAGNESIUM  PHOSPHORUS  HEMOGLOBIN A1C  TSH  BRAIN NATRIURETIC PEPTIDE  TROPONIN I (HIGH SENSITIVITY)    EKG EKG Interpretation  Date/Time:  Wednesday September 20 2020 16:02:13 EDT Ventricular Rate:  40 PR Interval:  119 QRS Duration: 164 QT Interval:  574 QTC Calculation: 469 R Axis:   -68 Text Interpretation: Heart block, unclear degree No STEMI Confirmed by Octaviano Glow [phone]) on 09/20/2020 4:10:05 PM  Radiology CT Angio Chest PE W and/or Wo Contrast  Result Date: 09/20/2020 CLINICAL DATA:  Bradycardia. Difficulty breathing.^174mL OMNIPAQUE IOHEXOL 350 MG/ML SOLNPE suspected,  high prob Hemoptysis EXAM: CT ANGIOGRAPHY CHEST WITH CONTRAST TECHNIQUE: Multidetector CT imaging of the chest was performed using the standard protocol during bolus administration of intravenous contrast. Multiplanar CT image reconstructions and MIPs were obtained to evaluate the vascular anatomy. CONTRAST:  136mL OMNIPAQUE IOHEXOL 350 MG/ML SOLN COMPARISON:  None. FINDINGS: Cardiovascular: No filling defects within the pulmonary arteries to suggest acute pulmonary embolism. No significant vascular findings. Normal heart size. No pericardial effusion. Mediastinum/Nodes: No axillary or supraclavicular adenopathy. No mediastinal or hilar adenopathy. No pericardial fluid. Esophagus normal. Lungs/Pleura: No suspicious pulmonary nodules. Normal pleural. Airways normal. Upper Abdomen: Limited view of the liver, kidneys, pancreas are unremarkable. Normal adrenal glands. Musculoskeletal: No acute osseous abnormality. Review of the MIP images confirms the above findings. IMPRESSION: 1. No acute pulmonary embolism. 2. No acute pulmonary parenchymal findings. Electronically Signed   By: Suzy Bouchard M.D.   On: 09/20/2020 18:44   DG Chest Port 1 View  Result Date: 09/20/2020 CLINICAL DATA:  Bradycardia.  Chest  pain. EXAM: PORTABLE CHEST 1 VIEW COMPARISON:  CT 13 10 FINDINGS: Mild cardiomegaly. No pulmonary edema. Left lung base partially obscured by overlying monitoring devices. There is no right pleural effusion. No confluent consolidation. No pneumothorax. No acute osseous abnormalities are seen IMPRESSION: Mild cardiomegaly without congestive failure. Electronically Signed   By: Keith Rake M.D.   On: 09/20/2020 16:46    Procedures .Critical Care  Date/Time: 09/20/2020 7:57 PM Performed by: Wyvonnia Dusky, MD Authorized by: Wyvonnia Dusky, MD   Critical care provider statement:    Critical care time (minutes):  45   Critical care was necessary to treat or prevent imminent or life-threatening deterioration of the following conditions:  Circulatory failure   Critical care was time spent personally by me on the following activities:  Discussions with consultants, evaluation of patient's response to treatment, examination of patient, ordering and performing treatments and interventions, ordering and review of laboratory studies, ordering and review of radiographic studies, pulse oximetry, re-evaluation of patient's condition, obtaining history from patient or surrogate and review of old charts   Medications Ordered in ED Medications  hydrALAZINE (APRESOLINE) tablet 10 mg (has no administration in time range)  ascorbic acid (VITAMIN C) tablet 500 mg (has no administration in time range)  zinc sulfate capsule 220 mg (has no administration in time range)  guaiFENesin-dextromethorphan (ROBITUSSIN DM) 100-10 MG/5ML syrup 10 mL (has no administration in time range)  folic acid (FOLVITE) tablet 1 mg (has no administration in time range)  multivitamin with minerals tablet 1 tablet (has no administration in time range)  thiamine tablet 100 mg (has no administration in time range)  Ipratropium-Albuterol (COMBIVENT) respimat 1 puff (has no administration in time range)  insulin aspart (novoLOG)  injection 0-9 Units (has no administration in time range)  insulin aspart (novoLOG) injection 0-5 Units (has no administration in time range)  rosuvastatin (CRESTOR) tablet 10 mg (has no administration in time range)  enoxaparin (LOVENOX) injection 40 mg (has no administration in time range)  iohexol (OMNIPAQUE) 350 MG/ML injection 100 mL (100 mLs Intravenous Contrast Given 09/20/20 1832)    ED Course  I have reviewed the triage vital signs and the nursing notes.  Pertinent labs & imaging results that were available during my care of the patient were reviewed by me and considered in my medical decision making (see chart for details).  1 female presenting to be in complete heart block.  She is essentially asymptomatic.  Hypertensive on arrival.  No  hypoxia.  Labs reviewed.  Electrolytes unremarkable.  Troponin is mildly elevated but flat on repeat.  I doubt this acute coronary syndrome.  COVID is positive.  We will place her in airborne precautions.  It is not clear the onset of her COVID symptoms, as she essentially does not have any symptoms.  Patient has replaced in the patient.  Telemetry reviewed.  She remains in complete heart block with a ventricular rate in the 30s  Cardiology was consulted in the ED and recommended hospitalization overnight for pacemaker placement.  CT PE was ordered as the patient was complaining of a possible hemoptysis.  The scan did not show any acute findings in the lungs or any evidence of acute PE.    Clinical Course as of 09/20/20 2043  Wed Sep 20, 2020  1610 EKG shows unclear degree heart block, but possibly third-degree as it does appear to be regular PP intervals and RR intervals that are dissociated from 1 another.  Heart rate of 40s also consistent with complete heart block.  The patient has minimal symptoms otherwise.  Blood pressure in the room is normal [MT]  3810 Bp 175 systolic in room [MT]  1025 I spoke to the cards master Trish, consult placed  for heart block evaluation [MT]  8527 Cardiology has evaluated the patient anticipates that she will need a pacemaker for complete heart block.  I reevaluated the patient and she had reported to me that she may be having some hemoptysis for the past week.  She is not clear whether the source of bleeding is from her lungs or her mouth.  However with her minor elevated troponin in this report, I discussed with cardiology obtaining a CT scan to rule out PE.  If this is negative, she will be admitted to the cardiology service.  I discussed this plan with both the patient and her daughter and granddaughter in the room and all verbalized agreement.  She remains otherwise asymptomatic and well appearing.  Pacer pads in place [MT]  1849 IMPRESSION: 1. No acute pulmonary embolism. 2. No acute pulmonary parenchymal findings. [MT]  1948 She is COVID-positive.  Is not clear the onset of her symptoms that she has no other active symptoms.  We will have to assume it is acute and place her on airborne precautions.  Cardiology has recommended medical admission in light of this fact - they will re-evaluate for pacemaker placement in the morning. [MT]  1959 Admitted to the hospitalist. [MT]    Clinical Course User Index [MT] Shawndell Varas, Carola Rhine, MD    Final Clinical Impression(s) / ED Diagnoses Final diagnoses:  Complete heart block [facility])  COVID-19    Rx / DC Orders ED Discharge Orders     None        Wyvonnia Dusky, MD 09/20/20 2043

## 2020-09-20 NOTE — ED Triage Notes (Signed)
Pt reports bilateral foot swelling and darker color; blood spots when coughing . States everything started when she started taking glimepiride 1 mg and rosuvastatin 10 mg in April 2022.

## 2020-09-20 NOTE — ED Provider Notes (Signed)
[facility]    CSN: [id number] Arrival date & time: 09/20/20  1344      History   Chief Complaint Chief Complaint  Patient presents with   Foot Swelling    HPI Jacqueline Simon is a 70 y.o. female presenting with new onset of pedal edema, hemoptysis x3 weeks, getting worse.  Medical history diabetes, hypertension, obesity. States she was started on glimepiride and rosuvastatin 2 months ago.  Vague complaint of pedal edema and darkening of skin of feet for 3 weeks, getting worse.  Shortness of breath and hemoptysis, but states that she does have asthma and her inhalers are helping.  Denies dizziness, chest pain, weakness, headaches, vision changes.  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 No pertinent surgical history.  OB History   No obstetric history on file.      Home Medications    Prior to Admission medications   Medication Sig Start Date End Date Taking? Authorizing Provider  ACCU-CHEK AVIVA PLUS test strip  09/26/17   [provider]  ACCU-CHEK SOFTCLIX LANCETS lancets  09/26/17   [provider]  albuterol (PROVENTIL HFA;VENTOLIN HFA) 108 (90 BASE) MCG/ACT inhaler Inhale 1 puff into the lungs at bedtime as needed for wheezing or shortness of breath.    [provider]  aspirin EC 81 MG tablet Take 81 mg by mouth daily.    [provider]  bisacodyl (WOMENS LAXATIVE) 5 MG EC tablet 1 tablet as needed    [provider]  Blood Glucose  Monitoring Suppl (ACCU-CHEK AVIVA PLUS) w/Device KIT  09/26/17   [provider]  calcium carbonate (TUMS) 500 MG chewable tablet 1 tablet    [provider]  cetirizine (ZYRTEC ALLERGY) 10 MG tablet 1 tablet    [provider]  cetirizine-pseudoephedrine (ZYRTEC-D) 5-120 MG per tablet Take 1 tablet by mouth daily.    [provider]  Cholecalciferol (VITAMIN D3) 50 MCG (2000 UT) CHEW 1 tablet    [provider]  fluticasone (FLOVENT HFA) 220 MCG/ACT inhaler Inhale 2 puffs into the lungs at bedtime as needed (shortness of breath/wheezing).    [provider]  FLUZONE HIGH-DOSE QUADRIVALENT 0.7 ML SUSY  02/25/20   [provider]  glimepiride (AMARYL) 1 MG tablet TK 1 T PO QD WITH BRE OR THE FIRST MAIN MEAL OF THE DAY 04/09/18   [provider]  losartan-hydrochlorothiazide (HYZAAR) 100-25 MG tablet  01/10/19   [provider]  metFORMIN (GLUCOPHAGE-XR) 500 MG 24 hr tablet Take 1,000 mg by mouth 2 (two) times daily.  01/17/14   [provider]  Multiple Vitamin (MULTIVITAMIN WITH MINERALS) TABS tablet Take 1 tablet by mouth daily.    [provider]  rosuvastatin (CRESTOR) 10 MG tablet  07/14/20   [provider]    Family History Family History  Problem Relation Age of Onset   Breast cancer Mother    Breast cancer Daughter     Social History Social History   Tobacco   Use   Smoking status: Every Day    Pack years: 0.00   Smokeless tobacco: Never  Vaping Use   Vaping Use: Never used  Substance Use Topics   Alcohol use: No   Drug use: No     Allergies   Lisinopril-hydrochlorothiazide, Tramadol, and Penicillins   Review of Systems Review of Systems  Respiratory:  Positive for shortness of breath.   Cardiovascular:  Positive for leg swelling.  All other systems reviewed and are negative.   Physical Exam Triage Vital Signs ED Triage Vitals  Enc Vitals Group     BP       Pulse      Resp      Temp      Temp src      SpO2      Weight      Height      Head Circumference      Peak Flow      Pain Score      Pain Loc      Pain Edu?      Excl. in [place]?    No data found.  Updated Vital Signs BP (!) 173/49 (BP Location: Right Arm)   Pulse (!) 39   Temp 98.8 F (37.1 C) (Oral)   Resp 20   SpO2 99%   Visual Acuity Right Eye Distance:   Left Eye Distance:   Bilateral Distance:    Right Eye Near:   Left Eye Near:    Bilateral Near:     Physical Exam Vitals reviewed.  Constitutional:      Appearance: Normal appearance. She is not diaphoretic.  HENT:     Head: Normocephalic and atraumatic.     Mouth/Throat:     Mouth: Mucous membranes are moist.  Eyes:     Extraocular Movements: Extraocular movements intact.     Pupils: Pupils are equal, round, and reactive to light.  Cardiovascular:     Rate and Rhythm: Normal rate and regular rhythm.     Pulses:          Radial pulses are 2+ on the right side and 2+ on the left side.     Heart sounds: Normal heart sounds.  Pulmonary:     Effort: Pulmonary effort is normal.     Breath sounds: Wheezing present.     Comments: Few wheezes throughout Abdominal:     Palpations: Abdomen is soft.     Tenderness: There is no abdominal tenderness. There is no guarding or rebound.  Musculoskeletal:     Right lower leg: 2+ Edema present.     Left lower leg: 2+ Edema present.  Skin:    General: Skin is warm.     Capillary Refill: Capillary refill takes less than 2 seconds.  Neurological:     General: No focal deficit present.     Mental Status: She is alert and oriented to person, place, and time.     Comments: PERRLA, EOMI CN 2-12 grossly intact  Psychiatric:        Mood and Affect: Mood normal.        Behavior: Behavior normal.        Thought Content: Thought content normal.        Judgment: Judgment normal.     UC Treatments / Results  Labs (all labs ordered are listed, but only abnormal results  are displayed) Labs Reviewed - No data to display  EKG   Radiology No results found.  Procedures  Procedures (including critical care time)  Medications Ordered in UC Medications - No data to display  Initial Impression / Assessment and Plan / UC Course  I have reviewed the triage vital signs and the nursing notes.  Pertinent labs & imaging results that were available during my care of the patient were reviewed by me and considered in my medical decision making (see chart for details).     This patient is a very pleasant 70 y.o. year old female presenting with onset of 3rd degree heart block. Bradycardic at 39 and BP 173/49. EKG showing 3rd degree heart block, no prior EKG for comparison. Pt with history hypertension, hyperlipidemia, diabetes- started on rosuvastatin and glimepiride 2 months ago. Also taking losartan-hctz as directed. Suspect this patient will be admitted for pacemaker, so coding this visit a Level 5. Transported to ED via EMS.   Final Clinical Impressions(s) / UC Diagnoses   Final diagnoses:  Symptomatic bradycardia  Heart block AV third degree (HCC)  Essential hypertension  Hyperlipidemia, unspecified hyperlipidemia type     Discharge Instructions      Discharged to ED via EMS     ED Prescriptions   None    PDMP not reviewed this encounter.   Graham, Laura E, PA-C 09/20/20 1521  

## 2020-09-20 NOTE — ED Notes (Signed)
Attempted report to [phone]. Charge RN states room has to be approved, however facilities needs to review pressure d/t COVID+ results.

## 2020-09-20 NOTE — ED Notes (Signed)
EMS, called, emergency transport and cardiac monitor requested.   Reported to ED chare nurse pt will go by EMS.

## 2020-09-20 NOTE — ED Notes (Signed)
Called 6E to give report. Charge RN to review chart and assign bed.

## 2020-09-20 NOTE — ED Triage Notes (Signed)
Pt here from UC for HR of 38 which appeared to MD to be 3rd deg HB.  Pt initial complaint was bil foot swelling for several weeks.  Denies dizziness, sob or chest pain.

## 2020-09-20 NOTE — H&P (Addendum)
History and Physical  [name] EQA:[id number] DOB: 1950-12-20 DOA: 09/20/2020  Referring physician: Dr. Langston Masker, [place]. PCP: Gaynelle Arabian, MD  Outpatient Specialists: Podiatry. Patient coming from: Home through urgent care.   Chief Complaint: Transferred to [facility] from urgent care due to complete heart block  HPI: Jacqueline Simon is a 70 y.o. female with medical history significant for type 2 diabetes, asthma, essential hypertension, no known prior history of cardiac disease who initially presented to urgent care due to worsening bilateral lower extremity edema of 3 weeks duration and recently noted lower extremity hyperpigmentation for the past 2 days.  She also reported spitting out a little bit of blood from her gum, has followed with a dentist as outpatient.  While at urgent care she was bradycardic and had a twelve-lead EKG which revealed complete heart block.  She reports a history of feeling like her heart slows down at times, for the past 10 years intermittently.  She feels better after using her albuterol inhaler.  Last episode was about 3 months ago, she let her PCP know but there was no work-up done.    She endorses orthopnea, using 3 pillows to sleep at night.  Associated with bilateral lower extremity edema for the past 3 weeks, no tenderness with palpation.  She has burning sensation in her feet.  No lengthy trips however for the past week she has been using a walker to ambulate due to burning sensation in her feet bilaterally.    Older brother with history of sudden death in his 52s, states it was thought to be related to his heart.  No personal history of tick bites.  Due to abnormal twelve-lead EKG with concern for advanced heart block, patient was subsequently transferred to [facility] ED for further evaluation and management of presenting condition.  She was placed on pacer pads in the ED.  Seen by electrophysiology/cardiology with plan for possible pacemaker  placement on 09/21/2020.   Incidentally tested positive for COVID-19 screening test.  She denies any fevers, fatigue, malaise, or GI symptoms.  She has been vaccinated against COVID x2.  No evidence of lobular infiltrates on chest x-ray to suggest pneumonia.  No changes in her asthmatic cough which is intermittent and improved with her albuterol inhaler.  Due to positive COVID-19 screening test cardiology recommended hospitalist team admission.  ED Course:  Temperature 98.3.  BP 175/60, pulse 36, respiration rate 16, O2 saturation 95% on room air.  CBC with differential essentially unremarkable.  Serum sodium 140, potassium 3.8, serum bicarb 29, BUN 14, creatinine 0.75, magnesium 1.9, GFR greater than 60, troponin 36.  Review of Systems: Review of systems as noted in the HPI. All other systems reviewed and are negative.   Past Medical History:  Diagnosis Date   Asthma    Diabetes mellitus without complication ([place])    Hypercholesteremia    Hypertension    History reviewed. No pertinent surgical history.  Social History:  reports that she has quit smoking. Her smoking use included cigarettes. She has never used smokeless tobacco. She reports that she does not drink alcohol and does not use drugs.   Allergies  Allergen Reactions   Lisinopril-Hydrochlorothiazide     Other reaction(s): felt bad   Tramadol     Other reaction(s): nausea   Penicillins Rash    Family History  Problem Relation Age of Onset   Breast cancer Mother    Breast cancer Daughter   Older brother with history of sudden  death in his 33s, states it was thought to be related to his heart.   Prior to Admission medications   Medication Sig Start Date End Date Taking? Authorizing Provider  albuterol (PROVENTIL HFA;VENTOLIN HFA) 108 (90 BASE) MCG/ACT inhaler Inhale 1-2 puffs into the lungs every 4 (four) hours as needed for wheezing or shortness of breath.   Yes [provider]  aspirin EC 81 MG tablet Take 81  mg by mouth daily.   Yes [provider]  cetirizine (ZYRTEC) 10 MG tablet Take 10 mg by mouth daily.   Yes [provider]  fluticasone (FLOVENT HFA) 220 MCG/ACT inhaler Inhale 1 puff into the lungs in the morning and at bedtime.   Yes [provider]  glimepiride (AMARYL) 1 MG tablet Take 1 mg by mouth daily with breakfast. 04/09/18  Yes [provider]  losartan-hydrochlorothiazide (HYZAAR) 100-25 MG tablet Take 1 tablet by mouth daily. 01/10/19  Yes [provider]  metFORMIN (GLUCOPHAGE-XR) 500 MG 24 hr tablet Take 1,000 mg by mouth 2 (two) times daily.  01/17/14  Yes [provider]  rosuvastatin (CRESTOR) 10 MG tablet Take 10 mg by mouth daily. 07/14/20  Yes [provider]    Physical Exam: BP (!) 198/69   Pulse (!) 36   Temp 98.3 F (36.8 C) (Temporal)   Resp 18   Ht 5\' 3"  (1.6 m)   Wt 93 kg   SpO2 95%   BMI 36.31 kg/m   General: 70 y.o. year-old female well developed well nourished in no acute distress.  Alert and oriented x3. Cardiovascular: Bradycardic with no rubs or gallops.  No thyromegaly or JVD noted.  2+ pitting edema in lower extremities bilaterally.  Hyperpigmentation from ankle down bilaterally suspected venous insufficiency.  2/4 pulses in all 4 extremities. Respiratory: Clear to auscultation with no wheezes or rales. Good inspiratory effort. Abdomen: Soft nontender nondistended with normal bowel sounds x4 quadrants. Muskuloskeletal: No cyanosis or clubbing.  2+ pitting edema lower extremities bilaterally. Neuro: CN II-XII intact, strength, sensation, reflexes Skin: No ulcerative lesions noted or rashes Psychiatry: Judgement and insight appear normal. Mood is appropriate for condition and setting          Labs on Admission:  Basic Metabolic Panel: Recent Labs  Lab 09/20/20 1609  NA 140  K 3.8  CL 105  CO2 29  GLUCOSE 102*  BUN 14  CREATININE 0.75  CALCIUM 9.1  MG 1.9   Liver Function  Tests: No results for input(s): AST, ALT, ALKPHOS, BILITOT, PROT, ALBUMIN in the last 168 hours. No results for input(s): LIPASE, AMYLASE in the last 168 hours. No results for input(s): AMMONIA in the last 168 hours. CBC: Recent Labs  Lab 09/20/20 1609  WBC 10.0  NEUTROABS 6.1  HGB 12.0  HCT 39.6  MCV 98.0  PLT 319   Cardiac Enzymes: No results for input(s): CKTOTAL, CKMB, CKMBINDEX, TROPONINI in the last 168 hours.  BNP (last 3 results) No results for input(s): BNP in the last 8760 hours.  ProBNP (last 3 results) No results for input(s): PROBNP in the last 8760 hours.  CBG: No results for input(s): GLUCAP in the last 168 hours.  Radiological Exams on Admission: CT Angio Chest PE W and/or Wo Contrast  Result Date: 09/20/2020 CLINICAL DATA:  Bradycardia. Difficulty breathing.^161mL OMNIPAQUE IOHEXOL 350 MG/ML SOLNPE suspected, high prob Hemoptysis EXAM: CT ANGIOGRAPHY CHEST WITH CONTRAST TECHNIQUE: Multidetector CT imaging of the chest was performed using the standard protocol during bolus administration  of intravenous contrast. Multiplanar CT image reconstructions and MIPs were obtained to evaluate the vascular anatomy. CONTRAST:  141mL OMNIPAQUE IOHEXOL 350 MG/ML SOLN COMPARISON:  None. FINDINGS: Cardiovascular: No filling defects within the pulmonary arteries to suggest acute pulmonary embolism. No significant vascular findings. Normal heart size. No pericardial effusion. Mediastinum/Nodes: No axillary or supraclavicular adenopathy. No mediastinal or hilar adenopathy. No pericardial fluid. Esophagus normal. Lungs/Pleura: No suspicious pulmonary nodules. Normal pleural. Airways normal. Upper Abdomen: Limited view of the liver, kidneys, pancreas are unremarkable. Normal adrenal glands. Musculoskeletal: No acute osseous abnormality. Review of the MIP images confirms the above findings. IMPRESSION: 1. No acute pulmonary embolism. 2. No acute pulmonary parenchymal findings. Electronically  Signed   By: Suzy Bouchard M.D.   On: 09/20/2020 18:44   DG Chest Port 1 View  Result Date: 09/20/2020 CLINICAL DATA:  Bradycardia.  Chest pain. EXAM: PORTABLE CHEST 1 VIEW COMPARISON:  CT 13 10 FINDINGS: Mild cardiomegaly. No pulmonary edema. Left lung base partially obscured by overlying monitoring devices. There is no right pleural effusion. No confluent consolidation. No pneumothorax. No acute osseous abnormalities are seen IMPRESSION: Mild cardiomegaly without congestive failure. Electronically Signed   By: Keith Rake M.D.   On: 09/20/2020 16:46    EKG: I independently viewed the EKG done and my findings are as followed: Complete heart block rate of 40.  QTc 469.  Assessment/Plan Present on Admission:  Complete heart block (HCC)  Active Problems:   Complete heart block (HCC)  Complete heart block, incidentally found on twelve-lead EKG. Seen by cardiology, unknown timing of onset, not on any AV nodal blockade agents. Pacer pads have been applied in the ED. Self-reported intermittent episodes of feeling of her heart slowing down improved after taking her home albuterol inhaler.  First noted 10 years ago, last episode about 3 months ago. Will obtain TSH Obtain 2D echo, follow results Seen by electrophysiology. Possible placement of pacemaker tomorrow 09/21/2020. N.p.o. after midnight. Closely monitor on telemetry in stepdown unit.  Uncontrolled hypertension Seen by electrophysiology/cardiology with recommendation for p.o. hydralazine as needed. Closely monitor vital signs Management per cardiology's recommendations.  Self-reported bleeding gums and questionable hemoptysis, follows with dentistry outpatient. Self-reported minimal bleeding from her gums, less than a spoonful, noted after waking up this morning.  States she follows up with a dentist. She had a CTA chest which ruled out PE in the ED. Hemoglobin is stable Monitor H&H while on pharmacological DVT  prophylaxis.  Positive COVID-19 screening test, incidental finding. Positive COVID-19 screening test on 09/20/2020, previously vaccinated against COVID 19 x 2 No consolidation on chest x-ray. Afebrile and nontoxic-appearing No GI symptoms Start multivitamin, vitamin D3 zinc and vitamin C Obtain daily inflammatory markers while hospitalized. Bronchodilators as needed Antitussives as needed Hold off remdesivir, can cause worsening of bradycardia May consider antiviral Paxlovid 3 tablets bid x 5 days when hemodynamically stable and after pacemaker placement. Monitor for now since asymptomatic.  Asthma Stable, not in exacerbation Resume home regimen  Elevated troponin, suspect demand ischemia First set of troponin 36 Cycle troponin Follow 2D echo Closely monitor on stepdown unit. Management per cardiology  Bilateral lower extremity edema, unclear etiology Possible underlying CHF versus suspected undiagnosed OSA No prior history of cardiac disease. Obtain BNP Follow 2D echo  Type 2 diabetes, with hyperlipidemia Update hemoglobin A1c Hold off home oral hypoglycemics Start insulin sliding scale  Hyperlipidemia Resume home Crestor.  Obesity with suspicion for OSA BMI 36 Recommend weight loss outpatient with regular physical activity and  healthy dieting. Will benefit from pulmonary follow-up to manage asthma and to assess for OSA.  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  Ambulatory dysfunction likely exacerbated by diabetic polyneuropathy States she has been using a walker for the past week due to burning sensation in her feet. PT OT to assess after pacemaker placement or when hemodynamically stable Fall precautions   DVT prophylaxis: Subcu Lovenox daily.  Code Status: Full code.  Family Communication: None at bedside.  Disposition Plan: Admit to progressive unit.  Consults called:  Electrophysiology, cardiology consulted by EDP.  Admission status: Inpatient status.  Patient will require at least 2 midnights for further evaluation and treatment of present condition.   Status is: Inpatient    Dispo:  Patient From: Home  Planned Disposition: Home  Medically stable for discharge: No         Kayleen Memos MD [facility] Pager [phone]  If 7PM-7AM, please contact night-coverage [email or web address] Password [facility]  09/20/2020, 8:26 PM

## 2020-09-20 NOTE — Consult Note (Addendum)
Cardiology Consultation:   Patient ID: [name] MRN: [id number]; DOB: 08-Sep-1950  Admit date: 09/20/2020 Date of Consult: 09/20/2020  PCP:  Gaynelle Arabian, MD   [facility] Providers Cardiologist:     Patient Profile:   Jacqueline Simon is a 70 y.o. female with a hx of HTN, HLD, asthma, DM, obesitym hx of adenomatous colon polyps who is being seen 09/20/2020 for the evaluation of CHB at the request of Dr. Langston Masker.  History of Present Illness:   Jacqueline Simon went to an [facility] for c/o edema and cough w/hemoptysis intermittently for about 3 weeks.  She was found bradycardic 39bpm, in CHB, 173/49, she was transported to [facility] via EMS for further evaluation and management.  LABS  BMET, HS Trop are pending  WBC 10.0 H/H 12/39 Plts 319  COVID swab is pending  The patient reports that she feel very tired and has for many years, she has not had an acute change in her fatigue in any way but noticed that her feet started to swell about 3 weeks ago She has asthma with a component of SOB at baseline, but of late feels like she has been mor SOB and in the last week she has had intermittent productive cough that is "bloody" perhaps blood tinged.  She mentions she has bad teeth and maybe they bleed, but doesn't particularly taste blood unless she coughs it up  When asked about dizzy spells or fainting she says yes, that ever since she has had her recliner (3 years) she has had a couple episodes that she thinks she has fainted and wakes a little confused about what happened, the last time this happened apparently was quite a long time ago  She has not ha any drop attacks or clear syncope  She is a poor historian, discussed some palpitations she had 20 years ago or so that she saw a doctor for, no clear outcome on that.   Past Medical History:  Diagnosis Date   Asthma    Diabetes mellitus without complication ([place])    Hypercholesteremia    Hypertension     History reviewed. No pertinent  surgical history.   Home Medications:  Prior to Admission medications   Medication Sig Start Date End Date Taking? Authorizing Provider  ACCU-CHEK AVIVA PLUS test strip  09/26/17   [provider]  ACCU-CHEK SOFTCLIX LANCETS lancets  09/26/17   [provider]  albuterol (PROVENTIL HFA;VENTOLIN HFA) 108 (90 BASE) MCG/ACT inhaler Inhale 1 puff into the lungs at bedtime as needed for wheezing or shortness of breath.    [provider]  aspirin EC 81 MG tablet Take 81 mg by mouth daily.    [provider]  bisacodyl (WOMENS LAXATIVE) 5 MG EC tablet 1 tablet as needed    [provider]  Blood Glucose Monitoring Suppl (ACCU-CHEK AVIVA PLUS) w/Device KIT  09/26/17   [provider]  calcium carbonate (TUMS) 500 MG chewable tablet 1 tablet    [provider]  cetirizine (ZYRTEC ALLERGY) 10 MG tablet 1 tablet    [provider]  cetirizine-pseudoephedrine (ZYRTEC-D) 5-120 MG per tablet Take 1 tablet by mouth daily.    [provider]  Cholecalciferol (VITAMIN D3) 50 MCG (2000 UT) CHEW 1 tablet    [provider]  fluticasone (FLOVENT HFA) 220 MCG/ACT inhaler Inhale 2 puffs into the lungs at bedtime as needed (shortness of breath/wheezing).    [provider]  FLUZONE HIGH-DOSE QUADRIVALENT 0.7 ML SUSY  02/25/20   [provider]  glimepiride (AMARYL) 1 MG tablet TK 1 T PO QD WITH BRE OR THE FIRST MAIN MEAL OF THE DAY 04/09/18   [provider]  losartan-hydrochlorothiazide (HYZAAR) 100-25 MG tablet  01/10/19   [provider]  metFORMIN (GLUCOPHAGE-XR) 500 MG 24 hr tablet Take 1,000 mg by mouth 2 (two) times daily.  01/17/14   [provider]  Multiple Vitamin (MULTIVITAMIN WITH MINERALS) TABS tablet Take 1 tablet by mouth daily.    [provider]  rosuvastatin (CRESTOR) 10 MG tablet  07/14/20   [provider]    Inpatient Medications: Scheduled  Meds:  Continuous Infusions:  PRN Meds:   Allergies:    Allergies  Allergen Reactions   Lisinopril-Hydrochlorothiazide     Other reaction(s): felt bad   Tramadol     Other reaction(s): nausea   Penicillins Rash    Social History:   Social History   Socioeconomic History   Marital status: Single    Spouse name: Not on file   Number of children: Not on file   Years of education: Not on file   Highest education level: Not on file  Occupational History   Not on file  Tobacco Use   Smoking status: Former    Years: 3.00    Pack years: 0.00    Types: Cigarettes   Smokeless tobacco: Never  Vaping Use   Vaping Use: Never used  Substance and Sexual Activity   Alcohol use: No   Drug use: No   Sexual activity: Not on file  Other Topics Concern   Not on file  Social History Narrative   Not on file   Social Determinants of Health   Financial Resource Strain: Not on file  Food Insecurity: Not on file  Transportation Needs: Not on file  Physical Activity: Not on file  Stress: Not on file  Social Connections: Not on file  Intimate Partner Violence: Not on file    Family History:   Family History  Problem Relation Age of Onset   Breast cancer Mother    Breast cancer Daughter      ROS:  Please see the history of present illness.  All other ROS reviewed and negative.     Physical Exam/Data:   Vitals:   09/20/20 1558 09/20/20 1559  Pulse: (!) 39   Resp: 18   Temp: 98.3 F (36.8 C)   TempSrc: Temporal   SpO2: 98%   Weight:  93 kg  Height:  _0  (1.6 m)   No intake or output data in the 24 hours ending 09/20/20 1635 Last 3 Weights 09/20/2020 02/20/2014  Weight (lbs) 205 lb 205 lb  Weight (kg) 92.987 kg 92.987 kg     Body mass index is 36.31 kg/m.  General:  Well nourished, well developed, in no acute distress HEENT: normal Lymph: no adenopathy Neck: no JVD Endocrine:  No thryomegaly Vascular: No carotid bruits  Cardiac:  RRR; bradycardic, no  murmurs, gallops or rubs Lungs: she has som soft exp wheezing throughout , is movig air OK, no rhonchi or rales  Abd: soft, nontender, obese  Ext: trace edema Musculoskeletal:  No deformities Skin: warm and dry  Neuro:  no focal abnormalities noted Psych:  Normal affect   EKG:  The EKG was personally reviewed and demonstrates:  #1 CHB 40bpm, icRBBB, LAD #2 CHB 40bpm  No historical EKGs  Telemetry:  Telemetry was personally reviewed and demonstrates:   CHB 35-40  Relevant CV Studies:  No historical cardiac data  Laboratory Data:  High Sensitivity Troponin:  No results for input(s): TROPONINIHS in the last 720 hours.   ChemistryNo results for input(s): NA, K, CL, CO2, GLUCOSE, BUN, CREATININE, CALCIUM, GFRNONAA, GFRAA, ANIONGAP in the last 168 hours.  No results for input(s): PROT, ALBUMIN, AST, ALT, ALKPHOS, BILITOT in the last 168 hours. HematologyNo results for input(s): WBC, RBC, HGB, HCT, MCV, MCH, MCHC, RDW, PLT in the last 168 hours. BNPNo results for input(s): BNP, PROBNP in the last 168 hours.  DDimer No results for input(s): DDIMER in the last 168 hours.   Radiology/Studies:  No results found.   Assessment and Plan:   CHB Unknown timing of onset No obvious reversible causes at this juncture She Niang [place]ree need likely pacing unless she has some marked lab abnormality.  She is minimally symptomatic if at all, CHB seems an incidental finding, perhaps though where her edema is coming from Echo  Pacer pads are on the patient  Mild abn HS Trop likely 2/2 bradycardia  2. HTN Hydralazine for now PRN  3. Cough/hemoptysis 4. Asthma   Dr. Curt Bears has seen an examined the patient She does not appear toxic though Celica Kotowski await COVID  testing If she is COVID + or has PE, request medicine admission (d/w ER MD)  I Ardyn Forge sign out to PM APP, ER MD Noreen Mackintosh reach out once test results are back, if above is negative cards Roy Snuffer admit and EP Baltazar Pekala follow back in the  morning         Risk Assessment/Risk Scores:  {For questions or updates, please contact [place] Please consult [email or web address] for contact info under    Signed, Baldwin Jamaica, PA-C  09/20/2020 4:35 PM  I have seen and examined this patient with Tommye Standard.  Agree with above, note added to reflect my findings.  On exam, bradycardic, regular, no murmurs.  Patient presented to the hospital with weakness and fatigue.  She was also noted to have a cough and associated possible hemoptysis.  Per the patient, she feels that her hemoptysis is due to bad teeth.  She did have a COVID test and this has been resulted as positive.  On initial ECG, she was found to be in complete heart block.  Would admit to monitor overnight.  We Timmy Cleverly get a transthoracic echo tomorrow.  Pending transthoracic echo and overall status, Quintella Mura potentially plan for pacemaker implant tomorrow.  Tyeasha Ebbs M. Merrick Feutz MD 09/20/2020 8:02 PM

## 2020-09-21 ENCOUNTER — Encounter (HOSPITAL_COMMUNITY): Payer: Self-pay | Admitting: Cardiology

## 2020-09-21 ENCOUNTER — Inpatient Hospital Stay (HOSPITAL_COMMUNITY): Payer: Medicare HMO

## 2020-09-21 ENCOUNTER — Inpatient Hospital Stay (HOSPITAL_COMMUNITY)
Admission: EM | Disposition: A | Discharge status: Home / Self Care | Payer: Self-pay | Source: Ambulatory Visit | Attending: Family Medicine

## 2020-09-21 DIAGNOSIS — U071 COVID-19: Secondary | ICD-10-CM

## 2020-09-21 DIAGNOSIS — R001 Bradycardia, unspecified: Secondary | ICD-10-CM | POA: Diagnosis not present

## 2020-09-21 HISTORY — PX: PACEMAKER IMPLANT: EP1218

## 2020-09-21 LAB — CBC WITH DIFFERENTIAL/PLATELET
Abs Immature Granulocytes: 0.02 10*3/uL (ref 0.00–0.07)
Basophils Absolute: 0 10*3/uL (ref 0.0–0.1)
Basophils Relative: 0 %
Eosinophils Absolute: 0.2 10*3/uL (ref 0.0–0.5)
Eosinophils Relative: 2 %
HCT: 39.5 % (ref 36.0–46.0)
Hemoglobin: 12.1 g/dL (ref 12.0–15.0)
Immature Granulocytes: 0 %
Lymphocytes Relative: 29 %
Lymphs Abs: 2.6 10*3/uL (ref 0.7–4.0)
MCH: 29.8 pg (ref 26.0–34.0)
MCHC: 30.6 g/dL (ref 30.0–36.0)
MCV: 97.3 fL (ref 80.0–100.0)
Monocytes Absolute: 0.8 10*3/uL (ref 0.1–1.0)
Monocytes Relative: 8 %
Neutro Abs: 5.4 10*3/uL (ref 1.7–7.7)
Neutrophils Relative %: 61 %
Platelets: 300 10*3/uL (ref 150–400)
RBC: 4.06 MIL/uL (ref 3.87–5.11)
RDW: 13.7 % (ref 11.5–15.5)
WBC: 8.9 10*3/uL (ref 4.0–10.5)
nRBC: 0 % (ref 0.0–0.2)

## 2020-09-21 LAB — ECHOCARDIOGRAM COMPLETE
Area-P 1/2: 4.36 cm2
Height: 62 in
S' Lateral: 2.5 cm
Weight: 3569.6 oz

## 2020-09-21 LAB — GLUCOSE, CAPILLARY
Glucose-Capillary: 103 mg/dL — ABNORMAL HIGH (ref 70–99)
Glucose-Capillary: 132 mg/dL — ABNORMAL HIGH (ref 70–99)
Glucose-Capillary: 105 mg/dL — ABNORMAL HIGH (ref 70–99)
Glucose-Capillary: 169 mg/dL — ABNORMAL HIGH (ref 70–99)

## 2020-09-21 LAB — BRAIN NATRIURETIC PEPTIDE: B Natriuretic Peptide: 231.9 pg/mL — ABNORMAL HIGH (ref 0.0–100.0)

## 2020-09-21 LAB — MAGNESIUM: Magnesium: 2 mg/dL (ref 1.7–2.4)

## 2020-09-21 LAB — HEMOGLOBIN A1C
Hgb A1c MFr Bld: 7.7 % — ABNORMAL HIGH (ref 4.8–5.6)
Mean Plasma Glucose: 174.29 mg/dL

## 2020-09-21 LAB — COMPREHENSIVE METABOLIC PANEL
ALT: 13 U/L (ref 0–44)
AST: 15 U/L (ref 15–41)
Albumin: 3.2 g/dL — ABNORMAL LOW (ref 3.5–5.0)
Alkaline Phosphatase: 74 U/L (ref 38–126)
Anion gap: 7 (ref 5–15)
BUN: 12 mg/dL (ref 8–23)
CO2: 29 mmol/L (ref 22–32)
Calcium: 9.2 mg/dL (ref 8.9–10.3)
Chloride: 102 mmol/L (ref 98–111)
Creatinine, Ser: 0.75 mg/dL (ref 0.44–1.00)
GFR, Estimated: >60 mL/min (ref >60)
Glucose, Bld: 191 mg/dL — ABNORMAL HIGH (ref 70–99)
Potassium: 3.2 mmol/L — ABNORMAL LOW (ref 3.5–5.1)
Sodium: 138 mmol/L (ref 135–145)
Total Bilirubin: 0.9 mg/dL (ref 0.3–1.2)
Total Protein: 7 g/dL (ref 6.5–8.1)

## 2020-09-21 LAB — SURGICAL PCR SCREEN
MRSA, PCR: NEGATIVE
Staphylococcus aureus: NEGATIVE

## 2020-09-21 LAB — C-REACTIVE PROTEIN: CRP: 0.6 mg/dL (ref <1.0)

## 2020-09-21 LAB — PHOSPHORUS: Phosphorus: 3.3 mg/dL (ref 2.5–4.6)

## 2020-09-21 LAB — TSH: TSH: 4.295 u[IU]/mL (ref 0.350–4.500)

## 2020-09-21 LAB — TROPONIN I (HIGH SENSITIVITY): Troponin I (High Sensitivity): 57 ng/L — ABNORMAL HIGH (ref <18)

## 2020-09-21 LAB — FERRITIN: Ferritin: 23 ng/mL (ref 11–307)

## 2020-09-21 LAB — D-DIMER, QUANTITATIVE: D-Dimer, Quant: 0.42 ug/mL-FEU (ref 0.00–0.50)

## 2020-09-21 SURGERY — PACEMAKER IMPLANT

## 2020-09-21 MED ORDER — SODIUM CHLORIDE 0.9% FLUSH
3.0000 mL | Freq: Two times a day (BID) | INTRAVENOUS | Status: DC
  Administered 2020-09-21: 3 mL via INTRAVENOUS

## 2020-09-21 MED ORDER — HEPARIN (PORCINE) IN NACL 1000-0.9 UT/500ML-% IV SOLN
INTRAVENOUS | Status: AC
  Filled 2020-09-21: qty 500

## 2020-09-21 MED ORDER — LIDOCAINE HCL (PF) 1 % IJ SOLN
INTRAMUSCULAR | Status: DC | PRN
  Administered 2020-09-21: 60 mL

## 2020-09-21 MED ORDER — DIPHENHYDRAMINE HCL 50 MG/ML IJ SOLN: 50.0000 mg | Freq: Once | INTRAMUSCULAR | Status: AC | PRN

## 2020-09-21 MED ORDER — HEPARIN (PORCINE) IN NACL 1000-0.9 UT/500ML-% IV SOLN
INTRAVENOUS | Status: DC | PRN
  Administered 2020-09-21: 500 mL

## 2020-09-21 MED ORDER — FAMOTIDINE 20 MG PO TABS
20.0000 mg | ORAL_TABLET | Freq: Two times a day (BID) | ORAL | Status: DC
  Administered 2020-09-21 – 2020-09-22 (×3): 20 mg via ORAL
  Filled 2020-09-21 (×3): qty 1

## 2020-09-21 MED ORDER — SODIUM CHLORIDE 0.9 % IV SOLN
INTRAVENOUS | Status: AC
  Filled 2020-09-21: qty 2

## 2020-09-21 MED ORDER — POTASSIUM CHLORIDE CRYS ER 20 MEQ PO TBCR
20.0000 meq | EXTENDED_RELEASE_TABLET | Freq: Four times a day (QID) | ORAL | Status: DC
  Administered 2020-09-21: 20 meq via ORAL
  Filled 2020-09-21: qty 1

## 2020-09-21 MED ORDER — ONDANSETRON HCL 4 MG/2ML IJ SOLN: 4.0000 mg | Freq: Four times a day (QID) | INTRAMUSCULAR | Status: DC | PRN

## 2020-09-21 MED ORDER — VANCOMYCIN HCL 1500 MG/300ML IV SOLN
1500.0000 mg | INTRAVENOUS | Status: AC
  Administered 2020-09-21: 1500 mg via INTRAVENOUS
  Filled 2020-09-21: qty 300

## 2020-09-21 MED ORDER — MIDAZOLAM HCL 2 MG/2ML IJ SOLN
INTRAMUSCULAR | Status: AC
  Filled 2020-09-21: qty 2

## 2020-09-21 MED ORDER — SODIUM CHLORIDE 0.9 % IV SOLN: INTRAVENOUS | Status: AC | PRN

## 2020-09-21 MED ORDER — EPINEPHRINE 0.3 MG/0.3ML IJ SOAJ
0.3000 mg | Freq: Once | INTRAMUSCULAR | Status: AC | PRN
  Filled 2020-09-21: qty 0.3

## 2020-09-21 MED ORDER — VANCOMYCIN HCL IN DEXTROSE 1-5 GM/200ML-% IV SOLN
INTRAVENOUS | Status: AC
  Filled 2020-09-21: qty 200

## 2020-09-21 MED ORDER — FENTANYL CITRATE (PF) 100 MCG/2ML IJ SOLN
INTRAMUSCULAR | Status: AC
  Filled 2020-09-21: qty 2

## 2020-09-21 MED ORDER — METHYLPREDNISOLONE SODIUM SUCC 125 MG IJ SOLR: 125.0000 mg | Freq: Once | INTRAMUSCULAR | Status: AC | PRN

## 2020-09-21 MED ORDER — FENTANYL CITRATE (PF) 100 MCG/2ML IJ SOLN
INTRAMUSCULAR | Status: DC | PRN
  Administered 2020-09-21: 25 ug via INTRAVENOUS

## 2020-09-21 MED ORDER — ALBUTEROL SULFATE HFA 108 (90 BASE) MCG/ACT IN AERS: 2.0000 | INHALATION_SPRAY | Freq: Once | RESPIRATORY_TRACT | Status: AC | PRN

## 2020-09-21 MED ORDER — LIDOCAINE HCL (PF) 1 % IJ SOLN
INTRAMUSCULAR | Status: AC
  Filled 2020-09-21: qty 30

## 2020-09-21 MED ORDER — POTASSIUM CHLORIDE CRYS ER 20 MEQ PO TBCR
40.0000 meq | EXTENDED_RELEASE_TABLET | ORAL | Status: AC
  Administered 2020-09-21 (×3): 40 meq via ORAL
  Filled 2020-09-21 (×3): qty 2

## 2020-09-21 MED ORDER — SODIUM CHLORIDE 0.9% FLUSH: 3.0000 mL | INTRAVENOUS | Status: DC | PRN

## 2020-09-21 MED ORDER — SODIUM CHLORIDE 0.9 % IV SOLN: INTRAVENOUS | Status: DC

## 2020-09-21 MED ORDER — SODIUM CHLORIDE 0.9 % IV SOLN
80.0000 mg | INTRAVENOUS | Status: AC
  Administered 2020-09-21 (×2): 80 mg

## 2020-09-21 MED ORDER — ALUM & MAG HYDROXIDE-SIMETH 200-200-20 MG/5ML PO SUSP
30.0000 mL | ORAL | Status: DC | PRN
  Administered 2020-09-21 – 2020-09-22 (×2): 30 mL via ORAL
  Filled 2020-09-21 (×2): qty 30

## 2020-09-21 MED ORDER — FAMOTIDINE IN NACL 20-0.9 MG/50ML-% IV SOLN
20.0000 mg | Freq: Once | INTRAVENOUS | Status: AC | PRN
  Filled 2020-09-21: qty 50

## 2020-09-21 MED ORDER — CHLORHEXIDINE GLUCONATE 4 % EX LIQD: 60.0000 mL | Freq: Once | CUTANEOUS | Status: DC

## 2020-09-21 MED ORDER — BEBTELOVIMAB 175 MG/2 ML IV (EUA)
175.0000 mg | Freq: Once | INTRAMUSCULAR | Status: AC
  Administered 2020-09-21: 175 mg via INTRAVENOUS
  Filled 2020-09-21: qty 2

## 2020-09-21 MED ORDER — VANCOMYCIN HCL 1000 MG/200ML IV SOLN
1000.0000 mg | Freq: Two times a day (BID) | INTRAVENOUS | Status: AC
  Administered 2020-09-21: 1000 mg via INTRAVENOUS
  Filled 2020-09-21: qty 200

## 2020-09-21 MED ORDER — MIDAZOLAM HCL 5 MG/5ML IJ SOLN
INTRAMUSCULAR | Status: DC | PRN
  Administered 2020-09-21: 1 mg via INTRAVENOUS

## 2020-09-21 MED ORDER — BEBTELOVIMAB 175 MG/2 ML IV (EUA): 175.0000 mg | Freq: Once | INTRAMUSCULAR | Status: DC

## 2020-09-21 SURGICAL SUPPLY — 9 items
CABLE SURGICAL S-101-97-12 (CABLE) ×2 IMPLANT
IPG PACE AZUR XT DR MRI W1DR01 (Pacemaker) IMPLANT
LEAD CAPSURE NOVUS 45CM (Lead) ×1 IMPLANT
LEAD CAPSURE NOVUS 5076-52CM (Lead) ×1 IMPLANT
PACE AZURE XT DR MRI W1DR01 (Pacemaker) ×2 IMPLANT
PAD PRO RADIOLUCENT 2001M-C (PAD) ×2 IMPLANT
SHEATH 7FR PRELUDE SNAP 13 (SHEATH) ×2 IMPLANT
SHEATH PROBE COVER 6X72 (BAG) ×1 IMPLANT
TRAY PACEMAKER INSERTION (PACKS) ×2 IMPLANT

## 2020-09-21 NOTE — Progress Notes (Addendum)
Progress Note  Patient Name: Jacqueline Simon Date of Encounter: 09/21/2020  [facility] Cardiologist: None   Subjective   Doing OK, no CP, SOB/cough about the same  Inpatient Medications    Scheduled Meds:  vitamin C  500 mg Oral Daily   enoxaparin (LOVENOX) injection  40 mg Subcutaneous Q24H   famotidine  20 mg Oral BID   fluticasone  1 puff Inhalation BID   folic acid  1 mg Oral Daily   gabapentin  100 mg Oral BID   insulin aspart  0-5 Units Subcutaneous QHS   insulin aspart  0-9 Units Subcutaneous TID WC   multivitamin with minerals  1 tablet Oral Daily   potassium chloride  40 mEq Oral Q4H   rosuvastatin  10 mg Oral Daily   thiamine  100 mg Oral Daily   zinc sulfate  220 mg Oral Daily   Continuous Infusions:  PRN Meds: acetaminophen, albuterol, alum & mag hydroxide-simeth, guaiFENesin-dextromethorphan, hydrALAZINE, Ipratropium-Albuterol, ondansetron (ZOFRAN) IV   Vital Signs    Vitals:   09/21/20 0035 09/21/20 0254 09/21/20 0400 09/21/20 0545  BP: 111/70 (!) 167/42  (!) 172/63  Pulse: (!) 36 (!) 37  (!) 38  Resp: 18 17  18   Temp: 98.2 F (36.8 C) 98 F (36.7 C)  97.7 F (36.5 C)  TempSrc: Oral Oral  Oral  SpO2: 94% 96%  93%  Weight:   101.2 kg   Height:       No intake or output data in the 24 hours ending 09/21/20 0814 Last 3 Weights 09/21/2020 09/20/2020 09/20/2020  Weight (lbs) 223 lb 1.6 oz 223 lb 1.6 oz 205 lb  Weight (kg) 101.197 kg 101.197 kg 92.987 kg      Telemetry    CHB 30's - Personally Reviewed  ECG    No new EKGs - Personally Reviewed  Physical Exam   GEN: No acute distress.   Neck: No JVD Cardiac: RRR, bradycardic, no murmurs, rubs, or gallops.  Respiratory: soft wheezing exp b/l. GI: Soft, nontender, non-distended  MS: No edema; No deformity. Neuro:  Nonfocal  Psych: Normal affect   Labs    High Sensitivity Troponin:   Recent Labs  Lab 09/20/20 1609 09/20/20 1809 09/20/20 2327  TROPONINIHS 36* 42* 57*       Chemistry Recent Labs  Lab 09/20/20 1609 09/21/20 0228  NA 140 138  K 3.8 3.2*  CL 105 102  CO2 29 29  GLUCOSE 102* 191*  BUN 14 12  CREATININE 0.75 0.75  CALCIUM 9.1 9.2  PROT  --  7.0  ALBUMIN  --  3.2*  AST  --  15  ALT  --  13  ALKPHOS  --  74  BILITOT  --  0.9  GFRNONAA >60 >60  ANIONGAP 6 7     Hematology Recent Labs  Lab 09/20/20 1609 09/21/20 0228  WBC 10.0 8.9  RBC 4.04 4.06  HGB 12.0 12.1  HCT 39.6 39.5  MCV 98.0 97.3  MCH 29.7 29.8  MCHC 30.3 30.6  RDW 13.7 13.7  PLT 319 300    BNP Recent Labs  Lab 09/20/20 2327  BNP 231.9*     DDimer  Recent Labs  Lab 09/21/20 0228  DDIMER 0.42     Radiology    CT Angio Chest PE W and/or Wo Contrast Result Date: 09/20/2020 CLINICAL DATA:  Bradycardia. Difficulty breathing.^168mL OMNIPAQUE IOHEXOL 350 MG/ML SOLNPE suspected, high prob Hemoptysis EXAM: CT ANGIOGRAPHY CHEST WITH CONTRAST TECHNIQUE:  Multidetector CT imaging of the chest was performed using the Simon protocol during bolus administration of intravenous contrast. Multiplanar CT image reconstructions and MIPs were obtained to evaluate the vascular anatomy. CONTRAST:  171mL OMNIPAQUE IOHEXOL 350 MG/ML SOLN COMPARISON:  None. FINDINGS: Cardiovascular: No filling defects within the pulmonary arteries to suggest acute pulmonary embolism. No significant vascular findings. Normal heart size. No pericardial effusion. Mediastinum/Nodes: No axillary or supraclavicular adenopathy. No mediastinal or hilar adenopathy. No pericardial fluid. Esophagus normal. Lungs/Pleura: No suspicious pulmonary nodules. Normal pleural. Airways normal. Upper Abdomen: Limited view of the liver, kidneys, pancreas are unremarkable. Normal adrenal glands. Musculoskeletal: No acute osseous abnormality. Review of the MIP images confirms the above findings. IMPRESSION: 1. No acute pulmonary embolism. 2. No acute pulmonary parenchymal findings. Electronically Signed   By: Suzy Bouchard M.D.   On: 09/20/2020 18:44   DG Chest Port 1 View Result Date: 09/20/2020 CLINICAL DATA:  Bradycardia.  Chest pain. EXAM: PORTABLE CHEST 1 VIEW COMPARISON:  CT 13 10 FINDINGS: Mild cardiomegaly. No pulmonary edema. Left lung base partially obscured by overlying monitoring devices. There is no right pleural effusion. No confluent consolidation. No pneumothorax. No acute osseous abnormalities are seen IMPRESSION: Mild cardiomegaly without congestive failure. Electronically Signed   By: Keith Rake M.D.   On: 09/20/2020 16:46    Cardiac Studies   Echo is ordered and pending  Patient Profile     70 y.o. female  with a hx of HTN, HLD, asthma, DM, obesitym hx of adenomatous colon polyps went to [facility] for increased cough, hemoptysis and edema, found to be in CHB and COVID +  Assessment & Plan     CHB No reversible causes She is hypokalemic this AM but yesterday only mildly so, further replacement has been ordered No reversible causes No nodal blocker at home BP stable (high), PRN hydralazine available  She Jacqueline Simon need pacing, Dr. Curt Bears has seen and examined her this AM, discussed again pacing/procedure and potential risks/benefots. She remains agreeable Jacqueline Simon plan for today as schedule allows.  Further with medicine team  2. Asthma 3. COVID +     Hemoptysis 4. DM  For questions or updates, please contact [place] Please consult [email or web address] for contact info under        Signed, Jacqueline Jamaica, PA-C  09/21/2020, 8:14 AM    I have seen and examined this patient with Jacqueline Simon.  Agree with above, note added to reflect my findings.  On exam, bradycardic, no murmurs.  Patient with continued complete heart block.  Awaiting echo.  She Jacqueline Simon need pacemaker implant.  Jacqueline Simon has presented today for surgery, with the diagnosis of complete AV block.  The various methods of treatment have been discussed with the patient and family. After consideration of risks,  benefits and other options for treatment, the patient has consented to  Procedure(s): Pacemaker implant as a surgical intervention .  Risks include but not limited to bleeding, infection, pneumothorax, perforation, tamponade, vascular damage, renal failure, MI, stroke, death, and lead dislodgement . The patient's history has been reviewed, patient examined, no change in status, stable for surgery.  I have reviewed the patient's chart and labs.  Questions were answered to the patient's satisfaction.    Jaziah Goeller Curt Bears, MD 09/21/2020 9:09 AM

## 2020-09-21 NOTE — Progress Notes (Signed)
   09/20/20 2345  Assess: MEWS Score  Temp 98.1 F (36.7 C)  BP (!) 224/70  Pulse Rate (!) 37  ECG Heart Rate (!) 45  Resp 20  SpO2 93 %  O2 Device Room Air  Assess: MEWS Score  MEWS Temp 0  MEWS Systolic 2  MEWS Pulse 1  MEWS RR 0  MEWS LOC 0  MEWS Score 3  MEWS Score Color Yellow  Assess: if the MEWS score is Yellow or Red  Were vital signs taken at a resting state? Yes  Focused Assessment No change from prior assessment  Early Detection of Sepsis Score *See Row Information* Low  MEWS guidelines implemented *See Row Information* Yes  Treat  MEWS Interventions Other (Comment) (Pt stable on unit)  Pain Scale 0-10  Pain Score 0  Take Vital Signs  Increase Vital Sign Frequency  Yellow: Q 2hr X 2 then Q 4hr X 2, if remains yellow, continue Q 4hrs  Escalate  MEWS: Escalate Yellow: discuss with charge nurse/RN and consider discussing with provider and RRT  Notify: Charge Nurse/RN  Name of Charge Nurse/RN Notified Dana, RN  Date Charge Nurse/RN Notified 09/20/20  Time Charge Nurse/RN Notified 2345  Document  Patient Outcome Stabilized after interventions  Progress note created (see row info) Yes

## 2020-09-21 NOTE — Progress Notes (Signed)
[facility] night shift.  The nursing staff reports that the patient is having indigestion with some belching.  She usually takes Alka-Seltzer or Tums at home. I have ordered Mylanta 30 mL every 4 hours as needed, begun famotidine 20 mg p.o. twice daily while she is in the hospital and added Zofran 4 mg IVP every 6 hours as needed.  Also the patient's potassium is 3.2 mmol/L this morning.  KCl 20 mEq p.o. 3 times daily x3 doses ordered.  Tennis Must, MD.

## 2020-09-21 NOTE — Progress Notes (Signed)
VAST consult received to obtain IV access for pacemaker placement today. Upon arrival, patient not in room.

## 2020-09-21 NOTE — Progress Notes (Signed)
PT Cancellation Note  Patient Details Name: JANAYAH ZAVADA MRN: [id number] DOB: Sep 09, 1950   Cancelled Treatment:    Reason Eval/Treat Not Completed: Active bedrest order. Pt with bedrest orders in place until morning of 6/24. PT will follow up once pt is off bedrest and appropriate to mobilize with PT.   Zenaida Niece 09/21/2020, 12:29 PM

## 2020-09-21 NOTE — Interval H&P Note (Signed)
History and Physical Interval Note:  09/21/2020 9:15 AM  Jacqueline Simon  has presented today for surgery, with the diagnosis of complete heart block.  The various methods of treatment have been discussed with the patient and family. After consideration of risks, benefits and other options for treatment, the patient has consented to  Procedure(s): PACEMAKER IMPLANT (N/A) as a surgical intervention.  The patient's history has been reviewed, patient examined, no change in status, stable for surgery.  I have reviewed the patient's chart and labs.  Questions were answered to the patient's satisfaction.     Sonny Anthes [employer]

## 2020-09-21 NOTE — Progress Notes (Addendum)
[facility] PROGRESS NOTE  Jacqueline Simon  HUT:[id number] DOB: [birth year] DOA: 09/20/2020 PCP: Gaynelle Arabian, MD  Brief Narrative: Jacqueline Simon is a 70 y.o. female with a history of NIDT2DM, HTN, well-controlled asthma who presented to [facility] 6/22 due to LE swelling found to have complete heart block and sent to the ED. Cardiology/EP consulted in the ED, planning pacemaker insertion. The patient screened positive for covid-19 infection (s/p remote J&J vaccine x1) with no dyspnea, fever, tachypnea, hypoxia, or infiltrate on CXR. Inflammatory markers are entirely normal. No lymphopenia, LFT elevations, thrombocytopenia. Medicine was asked to admit. Plan is to give monoclonal antibody due to risk factors and to proceed with management per EP.  Subjective: No dyspnea or chest pain. She's having belching and flatus without diarrhea. No one around her is exhibiting symptoms of covid nor have they been tested. Most have covid vaccinations. She reports no current or recent wheezing, dyspnea. She actually denies cough, though reports  spitting out blood she attributes to bleeding gums for which she is arranging dental follow up. She denies palpitations but exertional capacity has decreased over the past couple weeks.  Objective: BP (!) 195/64   Pulse (!) 38   Temp 97.7 F (36.5 C) (Oral)   Resp 18   Ht 5\' 2"  (1.575 m)   Wt 101.2 kg   SpO2 93%   BMI 40.81 kg/m   Gen: Pleasant 70yo F in no distress Pulm: Clear and nonlabored on room air. CV: CHB on monitor, rate between high 30's and low 50's. No murmur. +LE edema.  GI: Soft, NT, ND, +BS  Neuro: Alert and oriented. No focal deficits. Ext: Warm, no deformities Skin: No wounds on visualized skin.  Assessment & Plan: CHB: Without reversible etiology.  - EP taking for pacemaker insertion today  Incidental covid-19 infection: Without signs or symptoms in vaccinated patient.  - Due to her current statin use, and possible cardiac medication  initiation (many of which have contraindications to paxlovid), based on shared decision making with the patient today, we will proceed with monoclonal antibody infusion to reduce risk of developing symptoms/severe disease. The risks and benefits were discussed in detail.  - Would recommend 10 days of isolation unless symptoms arise.  Gingivitis: the patient and I suspect this is the cause of her "hemoptysis" in the absence of CXR findings and clear evidence of gingival irritation and its occurrence near the time of toothbrushing.  - Dental follow up recommended.   NIDT2DM: Glucose is currently at inpatient goal without intervention. Chronic CBG ~170 based on HbA1c 7.7% on 2 oral meds.  - Would hold oral medications while inpatient and continue SSI with plans to resume home medications at discharge.   Asthma: Well-controlled, no wheezing.  - Continue prn albuterol  Hypokalemia: Supplemented.  Patrecia Pour, MD [facility] [email or web address] 09/21/2020, 9:42 AM

## 2020-09-21 NOTE — H&P (View-Only) (Signed)
Progress Note  Patient Name: Jacqueline Simon Date of Encounter: 09/21/2020  [facility] Cardiologist: None   Subjective   Doing OK, no CP, SOB/cough about the same  Inpatient Medications    Scheduled Meds:  vitamin C  500 mg Oral Daily   enoxaparin (LOVENOX) injection  40 mg Subcutaneous Q24H   famotidine  20 mg Oral BID   fluticasone  1 puff Inhalation BID   folic acid  1 mg Oral Daily   gabapentin  100 mg Oral BID   insulin aspart  0-5 Units Subcutaneous QHS   insulin aspart  0-9 Units Subcutaneous TID WC   multivitamin with minerals  1 tablet Oral Daily   potassium chloride  40 mEq Oral Q4H   rosuvastatin  10 mg Oral Daily   thiamine  100 mg Oral Daily   zinc sulfate  220 mg Oral Daily   Continuous Infusions:  PRN Meds: acetaminophen, albuterol, alum & mag hydroxide-simeth, guaiFENesin-dextromethorphan, hydrALAZINE, Ipratropium-Albuterol, ondansetron (ZOFRAN) IV   Vital Signs    Vitals:   09/21/20 0035 09/21/20 0254 09/21/20 0400 09/21/20 0545  BP: 111/70 (!) 167/42  (!) 172/63  Pulse: (!) 36 (!) 37  (!) 38  Resp: 18 17  18   Temp: 98.2 F (36.8 C) 98 F (36.7 C)  97.7 F (36.5 C)  TempSrc: Oral Oral  Oral  SpO2: 94% 96%  93%  Weight:   101.2 kg   Height:       No intake or output data in the 24 hours ending 09/21/20 0814 Last 3 Weights 09/21/2020 09/20/2020 09/20/2020  Weight (lbs) 223 lb 1.6 oz 223 lb 1.6 oz 205 lb  Weight (kg) 101.197 kg 101.197 kg 92.987 kg      Telemetry    CHB 30's - Personally Reviewed  ECG    No new EKGs - Personally Reviewed  Physical Exam   GEN: No acute distress.   Neck: No JVD Cardiac: RRR, bradycardic, no murmurs, rubs, or gallops.  Respiratory: soft wheezing exp b/l. GI: Soft, nontender, non-distended  MS: No edema; No deformity. Neuro:  Nonfocal  Psych: Normal affect   Labs    High Sensitivity Troponin:   Recent Labs  Lab 09/20/20 1609 09/20/20 1809 09/20/20 2327  TROPONINIHS 36* 42* 57*       Chemistry Recent Labs  Lab 09/20/20 1609 09/21/20 0228  NA 140 138  K 3.8 3.2*  CL 105 102  CO2 29 29  GLUCOSE 102* 191*  BUN 14 12  CREATININE 0.75 0.75  CALCIUM 9.1 9.2  PROT  --  7.0  ALBUMIN  --  3.2*  AST  --  15  ALT  --  13  ALKPHOS  --  74  BILITOT  --  0.9  GFRNONAA >60 >60  ANIONGAP 6 7     Hematology Recent Labs  Lab 09/20/20 1609 09/21/20 0228  WBC 10.0 8.9  RBC 4.04 4.06  HGB 12.0 12.1  HCT 39.6 39.5  MCV 98.0 97.3  MCH 29.7 29.8  MCHC 30.3 30.6  RDW 13.7 13.7  PLT 319 300    BNP Recent Labs  Lab 09/20/20 2327  BNP 231.9*     DDimer  Recent Labs  Lab 09/21/20 0228  DDIMER 0.42     Radiology    CT Angio Chest PE W and/or Wo Contrast Result Date: 09/20/2020 CLINICAL DATA:  Bradycardia. Difficulty breathing.^175mL OMNIPAQUE IOHEXOL 350 MG/ML SOLNPE suspected, high prob Hemoptysis EXAM: CT ANGIOGRAPHY CHEST WITH CONTRAST TECHNIQUE:  Multidetector CT imaging of the chest was performed using the standard protocol during bolus administration of intravenous contrast. Multiplanar CT image reconstructions and MIPs were obtained to evaluate the vascular anatomy. CONTRAST:  162mL OMNIPAQUE IOHEXOL 350 MG/ML SOLN COMPARISON:  None. FINDINGS: Cardiovascular: No filling defects within the pulmonary arteries to suggest acute pulmonary embolism. No significant vascular findings. Normal heart size. No pericardial effusion. Mediastinum/Nodes: No axillary or supraclavicular adenopathy. No mediastinal or hilar adenopathy. No pericardial fluid. Esophagus normal. Lungs/Pleura: No suspicious pulmonary nodules. Normal pleural. Airways normal. Upper Abdomen: Limited view of the liver, kidneys, pancreas are unremarkable. Normal adrenal glands. Musculoskeletal: No acute osseous abnormality. Review of the MIP images confirms the above findings. IMPRESSION: 1. No acute pulmonary embolism. 2. No acute pulmonary parenchymal findings. Electronically Signed   By: Suzy Bouchard M.D.   On: 09/20/2020 18:44   DG Chest Port 1 View Result Date: 09/20/2020 CLINICAL DATA:  Bradycardia.  Chest pain. EXAM: PORTABLE CHEST 1 VIEW COMPARISON:  CT 13 10 FINDINGS: Mild cardiomegaly. No pulmonary edema. Left lung base partially obscured by overlying monitoring devices. There is no right pleural effusion. No confluent consolidation. No pneumothorax. No acute osseous abnormalities are seen IMPRESSION: Mild cardiomegaly without congestive failure. Electronically Signed   By: Keith Rake M.D.   On: 09/20/2020 16:46    Cardiac Studies   Echo is ordered and pending  Patient Profile     70 y.o. female  with a hx of HTN, HLD, asthma, DM, obesitym hx of adenomatous colon polyps went to [facility] for increased cough, hemoptysis and edema, found to be in CHB and COVID +  Assessment & Plan     CHB No reversible causes Jacqueline is hypokalemic this AM but yesterday only mildly so, further replacement has been ordered No reversible causes No nodal blocker at home BP stable (high), PRN hydralazine available  Jacqueline Jacqueline Simon need pacing, Dr. Curt Simon has seen and examined her this AM, discussed again pacing/procedure and potential risks/benefots. Jacqueline remains agreeable Jacqueline Simon plan for today as schedule allows.  Further with medicine team  2. Asthma 3. COVID +     Hemoptysis 4. DM  For questions or updates, please contact [place] Please consult [email or web address] for contact info under        Signed, Baldwin Jamaica, PA-C  09/21/2020, 8:14 AM    I have seen and examined this patient with Tommye Standard.  Agree with above, note added to reflect my findings.  On exam, bradycardic, no murmurs.  Patient with continued complete heart block.  Awaiting echo.  Jacqueline Simon need pacemaker implant.  Jacqueline Simon has presented today for surgery, with the diagnosis of complete AV block.  The various methods of treatment have been discussed with the patient and family. After consideration of risks,  benefits and other options for treatment, the patient has consented to  Procedure(s): Pacemaker implant as a surgical intervention .  Risks include but not limited to bleeding, infection, pneumothorax, perforation, tamponade, vascular damage, renal failure, MI, stroke, death, and lead dislodgement . The patient's history has been reviewed, patient examined, no change in status, stable for surgery.  I have reviewed the patient's chart and labs.  Questions were answered to the patient's satisfaction.    Jacqueline Kitt Curt Bears, MD 09/21/2020 9:09 AM

## 2020-09-21 NOTE — Progress Notes (Signed)
OT Cancellation Note  Patient Details Name: Jacqueline Simon MRN: [id number] DOB: 09-19-1950   Cancelled Treatment:    Reason Eval/Treat Not Completed: Active bedrest order. Pt with bedrest orders in place until morning of 6/24. OT will follow up once pt is off bedrest and as appropriate  Britt Bottom 09/21/2020, 2:50 PM

## 2020-09-22 ENCOUNTER — Inpatient Hospital Stay (HOSPITAL_COMMUNITY): Payer: Medicare HMO

## 2020-09-22 LAB — CBC WITH DIFFERENTIAL/PLATELET
Abs Immature Granulocytes: 0.03 10*3/uL (ref 0.00–0.07)
Basophils Absolute: 0 10*3/uL (ref 0.0–0.1)
Basophils Relative: 0 %
Eosinophils Absolute: 0.2 10*3/uL (ref 0.0–0.5)
Eosinophils Relative: 2 %
HCT: 38.5 % (ref 36.0–46.0)
Hemoglobin: 11.7 g/dL — ABNORMAL LOW (ref 12.0–15.0)
Immature Granulocytes: 0 %
Lymphocytes Relative: 20 %
Lymphs Abs: 1.7 10*3/uL (ref 0.7–4.0)
MCH: 29.5 pg (ref 26.0–34.0)
MCHC: 30.4 g/dL (ref 30.0–36.0)
MCV: 97.2 fL (ref 80.0–100.0)
Monocytes Absolute: 0.7 10*3/uL (ref 0.1–1.0)
Monocytes Relative: 9 %
Neutro Abs: 5.9 10*3/uL (ref 1.7–7.7)
Neutrophils Relative %: 69 %
Platelets: 253 10*3/uL (ref 150–400)
RBC: 3.96 MIL/uL (ref 3.87–5.11)
RDW: 13.6 % (ref 11.5–15.5)
WBC: 8.5 10*3/uL (ref 4.0–10.5)
nRBC: 0 % (ref 0.0–0.2)

## 2020-09-22 LAB — COMPREHENSIVE METABOLIC PANEL
ALT: 12 U/L (ref 0–44)
AST: 15 U/L (ref 15–41)
Albumin: 3.1 g/dL — ABNORMAL LOW (ref 3.5–5.0)
Alkaline Phosphatase: 71 U/L (ref 38–126)
Anion gap: 7 (ref 5–15)
BUN: 9 mg/dL (ref 8–23)
CO2: 30 mmol/L (ref 22–32)
Calcium: 9.1 mg/dL (ref 8.9–10.3)
Chloride: 102 mmol/L (ref 98–111)
Creatinine, Ser: 0.69 mg/dL (ref 0.44–1.00)
GFR, Estimated: >60 mL/min (ref >60)
Glucose, Bld: 121 mg/dL — ABNORMAL HIGH (ref 70–99)
Potassium: 4.5 mmol/L (ref 3.5–5.1)
Sodium: 139 mmol/L (ref 135–145)
Total Bilirubin: 1 mg/dL (ref 0.3–1.2)
Total Protein: 6.9 g/dL (ref 6.5–8.1)

## 2020-09-22 LAB — GLUCOSE, CAPILLARY
Glucose-Capillary: 121 mg/dL — ABNORMAL HIGH (ref 70–99)
Glucose-Capillary: 125 mg/dL — ABNORMAL HIGH (ref 70–99)

## 2020-09-22 MED FILL — Midazolam HCl Inj 2 MG/2ML (Base Equivalent): INTRAMUSCULAR | Qty: 1 | Status: AC

## 2020-09-22 MED FILL — Vancomycin HCl IV Soln 1000 MG/200ML (Base Equivalent): INTRAVENOUS | Qty: 200 | Status: AC

## 2020-09-22 NOTE — Care Management (Addendum)
09-22-20 1200 Late Entry: Case Manager called son Patrick Jupiter and discussed recommendations from PT for no PT follow up for home. Patient is at her baseline. Patient has cane, rolling walker and bedside commode in the home. No durable medical equipment needed at this time. Per son Patrick Jupiter, the patient is staying with her step daughter and will have supervision. No Home Health services will be arranged at this time. No further needs from Case Manager

## 2020-09-22 NOTE — Progress Notes (Signed)
OT Cancellation Note  Patient Details Name: Jacqueline Simon MRN: [id number] DOB: 11/29/1950   Cancelled Treatment:    Reason Eval/Treat Not Completed: OT screened, no needs identified, will sign off. Per PT, pt is at baseline, able to complete all ADL's and mobility as needed with no assist. Pt expected to d/c home this afternoon. If pt needs change, please reconsult.   Heliodoro Domagalski H., OTR/L Acute Rehabilitation  Taunja Brickner Elane Yolanda Bonine 09/22/2020, 12:13 PM

## 2020-09-22 NOTE — Discharge Summary (Addendum)
ELECTROPHYSIOLOGY PROCEDURE DISCHARGE SUMMARY    Patient ID: Jacqueline Simon,  MRN: [id number], DOB/AGE: 07-11-1950 70 y.o.  Admit date: 09/20/2020 Discharge date: 09/22/2020  Primary Care Physician: Gaynelle Arabian, MD  Primary Cardiologist/Electrophysiologist: NEW to [facility], Dr. Curt Bears  Primary Discharge Diagnosis:  CHB COVID  Secondary Discharge Diagnosis:  1 HTN 2 HLD 3 Asthma  4 DM   Allergies  Allergen Reactions   Lisinopril-Hydrochlorothiazide     Other reaction(s): felt bad   Tramadol     Other reaction(s): nausea   Penicillins Rash     Procedures This Admission:  1.  Implantation of a MDT dual chamber PPM on 09/21/20 by Dr Curt Bears.   The patient received  Medtronic Azure XT DR MRI SureScan (serial number [id number]) pacemaker, Medtronic model [id number] (serial number [id number]) right atrial lead and a Medtronic model 5076 (serial number [id number]) right ventricular lead There were no immediate post procedure complications. 2.  CXR on 09/22/20 demonstrated no pneumothorax status post device implantation.   Brief HPI: Jacqueline Simon is a 70 y.o. female  with a hx of HTN, HLD, asthma, DM, obesitym hx of adenomatous colon polyps went to an [facility] for c/o edema and cough w/hemoptysis intermittently for about 3 weeks.  She was found bradycardic 39bpm, in CHB, 173/49, she was transported to [facility] where was confirmed to be in [facility]B.  She was found COVID + and admitted for further evaluation and management   Hospital Course:  The patient was admitted given a clear CXR and no clear symptoms attributable to [place] felt to be an incidental finding.  In d/w IM, what was described as hemoptysis felt more likely gingival bleeding/disease.  She was given dose MCAB and felt from a medicine perspective OK to discharge once ready from a cardiac standpoint.  TTE noted preserved LVEF and she underwent implantation of a PPM with details as outlined above.  She was monitored on telemetry  overnight which demonstrated SR/V pacing.  Left chest was without hematoma or ecchymosis.  The device was interrogated and found to be functioning normally.  CXR was obtained and demonstrated no pneumothorax status post device implantation.  Wound care, arm mobility, and restrictions were reviewed with the patient.  The patient feels well, denies any CP/SOB, with minimal site discomfort.  She was examined by Dr. Curt Bears and considered stable for discharge to home.   BP is improving and Lindzey Zent resume her home medicines  She was advised to see her dentist and to stay at home for !) days wearing a mask when around other people   Physical Exam: Vitals:   09/21/20 2058 09/21/20 2336 09/22/20 0054 09/22/20 0436  BP: (!) 176/74 (!) 148/78 (!) 185/86 137/70  Pulse: 76 72 72 69  Resp: 18 15 13 13   Temp:   97.8 F (36.6 C) 98.4 F (36.9 C)  TempSrc:   Oral Oral  SpO2: 91% 95% 99% 95%  Weight:    102.6 kg  Height:        GEN- The patient is well appearing, alert and oriented x 3 today.   HEENT: normocephalic, atraumatic; sclera clear, conjunctiva pink; hearing intact; oropharynx clear; neck supple, no JVP Lungs- CTA b/l, normal work of breathing.  No wheezes, rales, rhonchi Heart- RRR, no murmurs, rubs or gallops, PMI not laterally displaced GI- soft, non-tender, non-distended Extremities- no clubbing, cyanosis, or edema MS- no significant deformity or atrophy Skin- warm and dry, no rash or lesion, left chest  without hematoma/ecchymosis Psych- euthymic mood, full affect Neuro- no gross deficits   Labs:   Lab Results  Component Value Date   WBC 8.5 09/22/2020   HGB 11.7 (L) 09/22/2020   HCT 38.5 09/22/2020   MCV 97.2 09/22/2020   PLT 253 09/22/2020    Recent Labs  Lab 09/22/20 0437  NA 139  K 4.5  CL 102  CO2 30  BUN 9  CREATININE 0.69  CALCIUM 9.1  PROT 6.9  BILITOT 1.0  ALKPHOS 71  ALT 12  AST 15  GLUCOSE 121*    Discharge Medications:  Allergies as of 09/22/2020        Reactions   Lisinopril-hydrochlorothiazide    Other reaction(s): felt bad   Tramadol    Other reaction(s): nausea   Penicillins Rash        Medication List     TAKE these medications    albuterol 108 (90 Base) MCG/ACT inhaler Commonly known as: VENTOLIN HFA Inhale 1-2 puffs into the lungs every 4 (four) hours as needed for wheezing or shortness of breath.   aspirin EC 81 MG tablet Take 81 mg by mouth daily.   cetirizine 10 MG tablet Commonly known as: ZYRTEC Take 10 mg by mouth daily.   fluticasone 220 MCG/ACT inhaler Commonly known as: FLOVENT HFA Inhale 1 puff into the lungs in the morning and at bedtime.   glimepiride 1 MG tablet Commonly known as: AMARYL Take 1 mg by mouth daily with breakfast.   losartan-hydrochlorothiazide 100-25 MG tablet Commonly known as: HYZAAR Take 1 tablet by mouth daily.   metFORMIN 500 MG 24 hr tablet Commonly known as: GLUCOPHAGE-XR Take 1,000 mg by mouth 2 (two) times daily.   rosuvastatin 10 MG tablet Commonly known as: CRESTOR Take 10 mg by mouth daily.               Discharge Care Instructions  (From admission, onward)           Start     Ordered   09/22/20 0000  Discharge wound care:       Comments: As noted in AVS   09/22/20 0836            Disposition: Home Discharge Instructions     Diet - low sodium heart healthy   Complete by: As directed    Discharge wound care:   Complete by: As directed    As noted in AVS   Increase activity slowly   Complete by: As directed        Follow-up Information     [facility] Follow up.   Specialty: Cardiology Why: 10/05/20 @ 3:20PM, wound check visit Contact information: [address], [address]        Constance Haw, MD Follow up.   Specialty: Cardiology Why: 01/04/21 @ 2:15PM Contact information: [place] [address] [phone]                  Duration of Discharge Encounter: Greater than 30 minutes including physician time.  Venetia Night, PA-C 09/22/2020 8:36 AM   I have seen and examined this patient with Tommye Standard.  Agree with above, note added to reflect my findings.  On exam, RRR, no murmurs.  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  Device functioning appropriately.  Chest x-ray and interrogation without issue.  Plan for discharge today with follow-up  in device clinic.  She was incidentally found to have COVID.  This was managed by internal medicine.  Eldon Zietlow M. Daniya Aramburo MD 09/22/2020 8:40 AM

## 2020-09-22 NOTE — Discharge Instructions (Addendum)
STAY HOME WEARING A MASK WHEN AROUND PEOPLE FOR 10 DAYS.  ONCE YOUR 10 DAY ISOLATION IS COMPLETED, SEE YOUR DENTIST FOR EVALUATION         Supplemental Discharge Instructions for  Pacemaker/Defibrillator Patients    Activity No heavy lifting or vigorous activity with your left/right arm for 6 to 8 weeks.  Do not raise your left/right arm above your head for one week.  Gradually raise your affected arm as drawn below.             09/26/20                     09/27/20                   09/28/20                   09/29/20 __  NO DRIVING until cleared to at your wound check visit.  WOUND CARE Keep the wound area clean and dry.  Do not get this area wet , no showers until cleared to at your wound check visit. The tape/steri-strips on your wound will fall off; do not pull them off.  No bandage is needed on the site.  DO  NOT apply any creams, oils, or ointments to the wound area. If you notice any drainage or discharge from the wound, any swelling or bruising at the site, or you develop a fever > 101? F after you are discharged home, call the office at once.  Special Instructions You are still able to use cellular telephones; use the ear opposite the side where you have your pacemaker/defibrillator.  Avoid carrying your cellular phone near your device. When traveling through airports, show security personnel your identification card to avoid being screened in the metal detectors.  Ask the security personnel to use the hand wand. Avoid arc welding equipment, MRI testing (magnetic resonance imaging), TENS units (transcutaneous nerve stimulators).  Call the office for questions about other devices. Avoid electrical appliances that are in poor condition or are not properly grounded. Microwave ovens are safe to be near or to operate.

## 2020-09-22 NOTE — Evaluation (Signed)
Physical Therapy Evaluation & Discharge Patient Details Name: Jacqueline Simon MRN: [id number] DOB: 06/13/1950 Today's Date: 09/22/2020   History of Present Illness  Pt is a 69 y.o. female admitted 09/20/20 with complete heart block; tested (+) COVID-19. S/p pacemaker insertion 6/23. PMH includes DM2, asthma, HTN.   Clinical Impression  Patient evaluated by Physical Therapy with no further acute PT needs identified. PTA, pt mod indep with rollator, lives with family. Today, pt mod indep ambulating with rollator. Educ re: pacemaker precautions, activity restrictions, activity recommendations. All education has been completed and the patient has no further questions. Acute PT is signing off. Thank you for this referral.  SpO2 97% on RA, HR 71, post-ambulation BP 173/79    Follow Up Recommendations No PT follow up    Equipment Recommendations  None recommended by PT    Recommendations for Other Services       Precautions / Restrictions Precautions Precautions: ICD/Pacemaker Restrictions Weight Bearing Restrictions: No LUE Weight Bearing: Partial weight bearing      Mobility  Bed Mobility               General bed mobility comments: Received siting OOB    Transfers Overall transfer level: Modified independent Equipment used: Rolling walker (2 wheeled)                Ambulation/Gait Ambulation/Gait assistance: Modified independent (Device/Increase time) Gait Distance (Feet): 100 Feet Assistive device: 4-wheeled walker Gait Pattern/deviations: Step-through pattern;Decreased stride length Gait velocity: Decreased   General Gait Details: Slow, steady gait mod indep with rollator; distance limited in room secondary to COVID+ precautions  Stairs            Wheelchair Mobility    Modified Rankin (Stroke Patients Only)       Balance Overall balance assessment: Mild deficits observed, not formally tested                                            Pertinent Vitals/Pain Pain Assessment: No/denies pain    Home Living Family/patient expects to be discharged to:: Private residence Living Arrangements: Other relatives Available Help at Discharge: Family;Available 24 hours/day Type of Home: House Home Access: Stairs to enter Entrance Stairs-Rails: None Entrance Stairs-Number of Steps: 3 Home Layout: One level Home Equipment: Walker - 4 wheels;Bedside commode;Shower seat      Prior Function Level of Independence: Independent with assistive device(s)         Comments: Mod indep with rollator; sits on shower seat to shower; enjoys watching grandkids; performing household tasks and cooking     Hand Dominance        Extremity/Trunk Assessment   Upper Extremity Assessment Upper Extremity Assessment: Overall WFL for tasks assessed (LUE within limits of pacemaker precautions)    Lower Extremity Assessment Lower Extremity Assessment: Overall WFL for tasks assessed    Cervical / Trunk Assessment Cervical / Trunk Assessment: Kyphotic  Communication   Communication: No difficulties  Cognition Arousal/Alertness: Awake/alert Behavior During Therapy: WFL for tasks assessed/performed Overall Cognitive Status: Within Functional Limits for tasks assessed                                 General Comments: Some decreased attention noted, suspect at baseline cognition      General Comments General comments (skin integrity, edema,  etc.): SpO2 97% on RA, HR 71, post-ambulation BP 173/79. Reviewed pacemaker precautions (lifting, ROM, DME use), activity recommendations, as well as quarantining from family members due to COVID+ status. Pt reports having necessary assist from family if needed    Exercises     Assessment/Plan    PT Assessment Patent does not need any further PT services  PT Problem List         PT Treatment Interventions      PT Goals (Current goals can be found in the Care Plan section)   Acute Rehab PT Goals PT Goal Formulation: All assessment and education complete, DC therapy    Frequency     Barriers to discharge        Co-evaluation               AM-PAC PT "6 Clicks" Mobility  Outcome Measure Help needed turning from your back to your side while in a flat bed without using bedrails?: None Help needed moving from lying on your back to sitting on the side of a flat bed without using bedrails?: None Help needed moving to and from a bed to a chair (including a wheelchair)?: None Help needed standing up from a chair using your arms (e.g., wheelchair or bedside chair)?: None Help needed to walk in hospital room?: None Help needed climbing 3-5 steps with a railing? : A Little 6 Click Score: 23    End of Session   Activity Tolerance: Patient tolerated treatment well Patient left: in chair;with call bell/phone within reach;with nursing/sitter in room Nurse Communication: Mobility status PT Visit Diagnosis: Other abnormalities of gait and mobility (R26.89)    Time: 7408-1448 PT Time Calculation (min) (ACUTE ONLY): 15 min   Charges:   PT Evaluation $PT Eval Low Complexity: Grady, PT, DPT Acute Rehabilitation Services  Pager [phone] Office [place] 09/22/2020, 12:36 PM

## 2020-09-25 ENCOUNTER — Telehealth: Payer: Self-pay | Admitting: Cardiology

## 2020-09-25 NOTE — Telephone Encounter (Signed)
pt said that she broke out into a rash around her device, rash got hot and red.. took a benadryl which helped subside the pain. Please advise if any recommendation

## 2020-09-26 ENCOUNTER — Telehealth: Payer: Self-pay | Admitting: Cardiology

## 2020-09-26 NOTE — Telephone Encounter (Incomplete)
PT is calling she is trying to email the nurse a picture of her wound but it keeps rejecting it

## 2020-09-26 NOTE — Telephone Encounter (Signed)
Successful telephone encounter to Jacqueline Simon to follow up on c/o "rash" at device implant site. Per patient states she had an itchy "rash" around her device site which she felt was related to previously removed transparent dressing. She took 1 benadryl this morning and "rash" is improving and no longer "itchy". She admits to swelling that has improved since implant. Steri-strips remain intact. Patient advised she can use ice pack to relieve any warmth or itching from "rash". She will attempt to securely email picture of wound site. Wound check appointment scheduled for 10/05/20@ 3:20 pm.

## 2020-09-29 ENCOUNTER — Ambulatory Visit: Payer: Medicare HMO

## 2020-10-05 ENCOUNTER — Other Ambulatory Visit: Payer: Self-pay

## 2020-10-05 ENCOUNTER — Ambulatory Visit (INDEPENDENT_AMBULATORY_CARE_PROVIDER_SITE_OTHER): Payer: Medicare HMO

## 2020-10-05 DIAGNOSIS — I442 Atrioventricular block, complete: Secondary | ICD-10-CM

## 2020-10-05 NOTE — Patient Instructions (Addendum)

## 2020-10-06 LAB — CUP PACEART INCLINIC DEVICE CHECK
Battery Remaining Longevity: 143 mo
Battery Voltage: 3.21 V
Brady Statistic AP VP Percent: 0.32 %
Brady Statistic AP VS Percent: 0 %
Brady Statistic AS VP Percent: 98.6 %
Brady Statistic AS VS Percent: 1.08 %
Brady Statistic RA Percent Paced: 0.45 %
Brady Statistic RV Percent Paced: 98.92 %
Date Time Interrogation Session: 20220707154900
Implantable Lead Implant Date: 20220623
Implantable Lead Implant Date: 20220623
Implantable Lead Location: 753859
Implantable Lead Location: 753860
Implantable Lead Model: 5076
Implantable Lead Model: 5076
Implantable Pulse Generator Implant Date: 20220623
Lead Channel Impedance Value: 361 Ohm
Lead Channel Impedance Value: 437 Ohm
Lead Channel Impedance Value: 608 Ohm
Lead Channel Impedance Value: 608 Ohm
Lead Channel Pacing Threshold Amplitude: 1 V
Lead Channel Pacing Threshold Amplitude: 1.25 V
Lead Channel Pacing Threshold Pulse Width: 0.4 ms
Lead Channel Pacing Threshold Pulse Width: 0.7 ms
Lead Channel Sensing Intrinsic Amplitude: 2.625 mV
Lead Channel Setting Pacing Amplitude: 3.5 V
Lead Channel Setting Pacing Amplitude: 3.5 V
Lead Channel Setting Pacing Pulse Width: 0.7 ms
Lead Channel Setting Sensing Sensitivity: 1.2 mV

## 2020-10-06 NOTE — Progress Notes (Signed)
Wound check appointment. Steri-strips removed. Wound without redness or edema. Incision edges approximated, wound well healed. Normal device function. Thresholds, sensing, and impedances consistent with implant measurements however RV threshold now 1.5V@0 .74ms. Consulted Industry who assisted with adjusting pusle width to lower today's threshold to 1.0@0 .72ms which will allow for a more appropriate safety margin at 91 day visit when threshold is decreased to clinic standards of 2.5 for RV. Device programmed at 3.5V/auto capture programmed on for extra safety margin until 3 month visit. Histogram distribution appropriate for patient and level of activity. No mode switches or high ventricular rates noted. Patient educated about wound care, arm mobility, lifting restrictions. Patient enrolled in remote monitoring. Next transmission scheduled 12/22/20. 91 day ROV follow up with Dr. Curt Bears 01/04/21.

## 2020-10-13 IMAGING — MG DIGITAL SCREENING BILAT W/ TOMO W/ CAD
6 of 10 series · 6 of 30 positions shown · non-contrast
Comparison: Previous exam(s).

CLINICAL DATA: Screening.

EXAM:
DIGITAL SCREENING BILATERAL MAMMOGRAM WITH TOMO AND CAD

[R CC synth-2D]
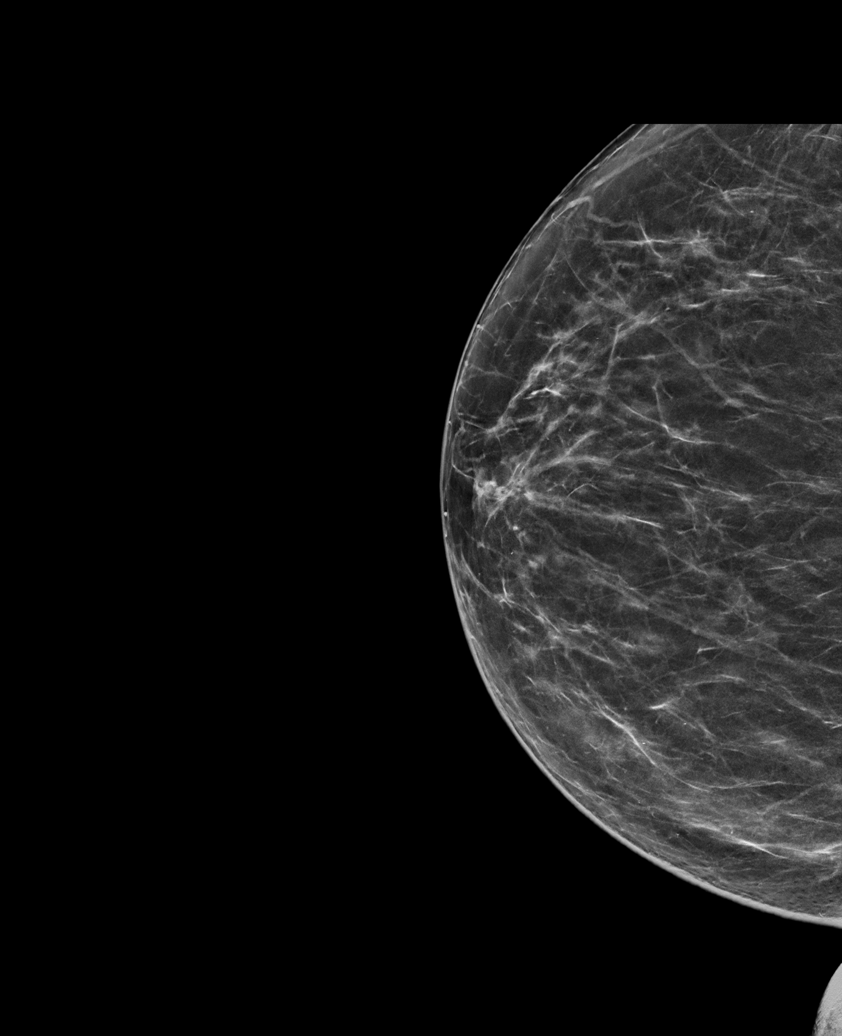

[L CC synth-2D]
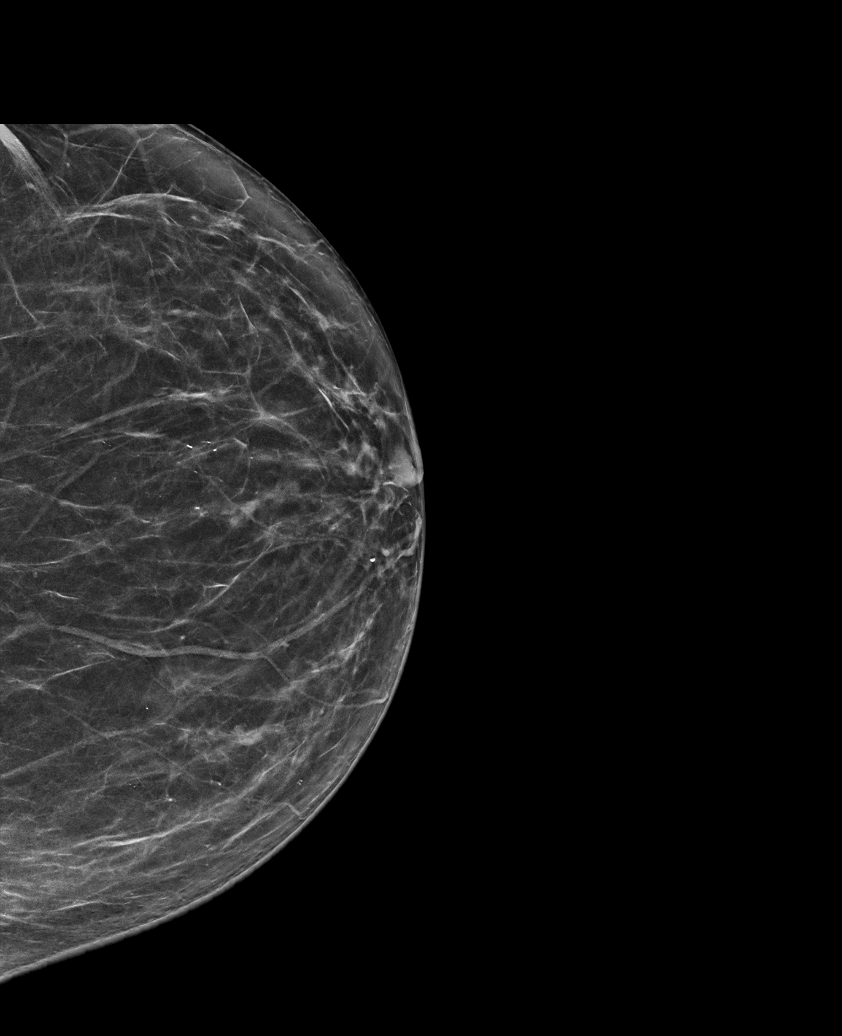

[R MLO synth-2D (1 of 2)]
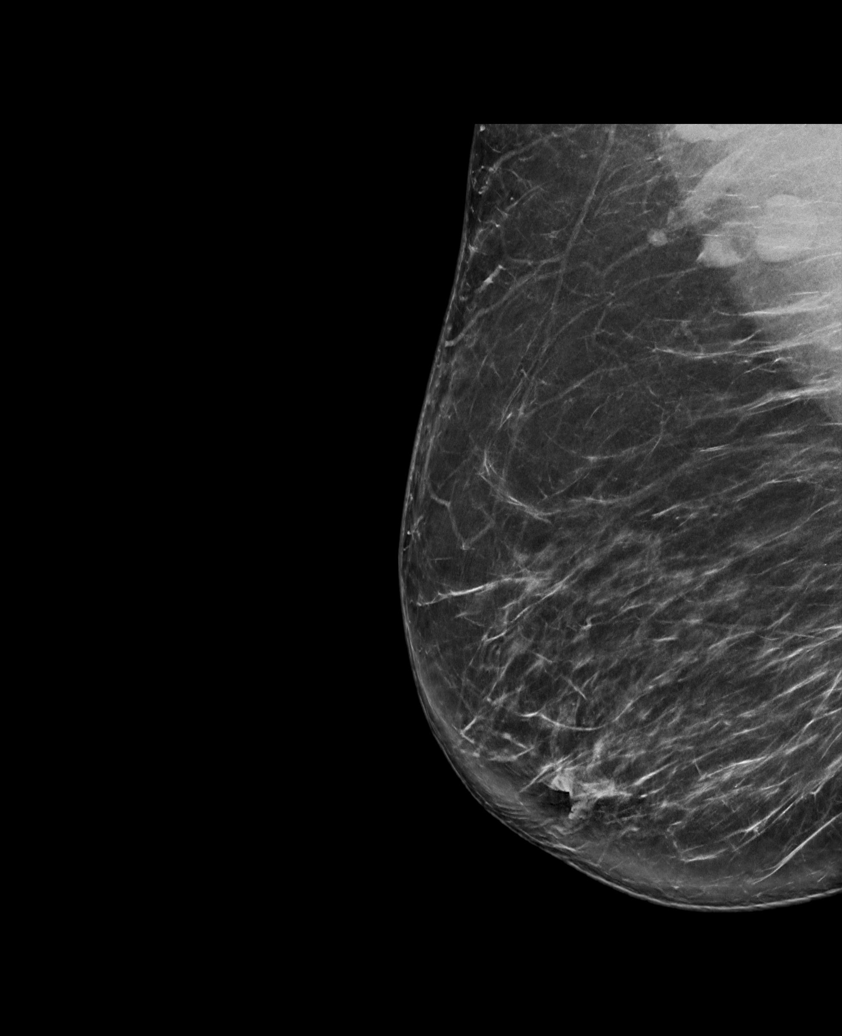

[L MLO synth-2D]
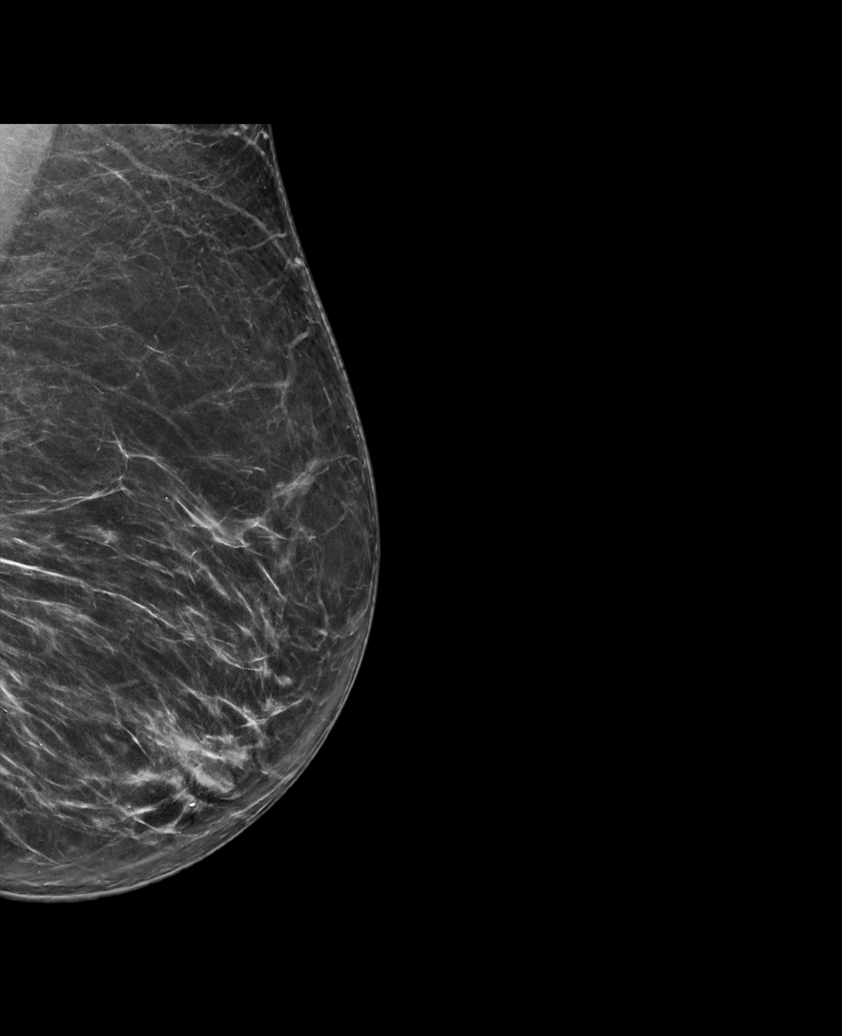

[R MLO synth-2D (2 of 2)]
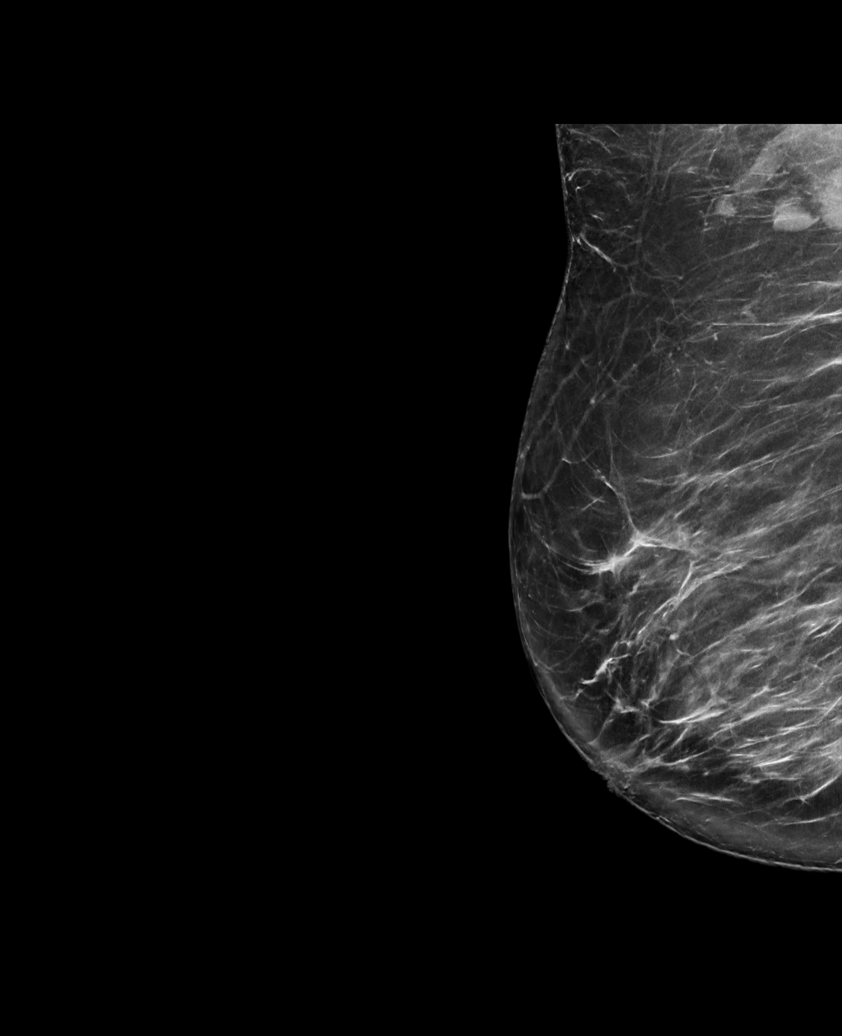

[R MLO tomo · tomo slice 43/84.0]
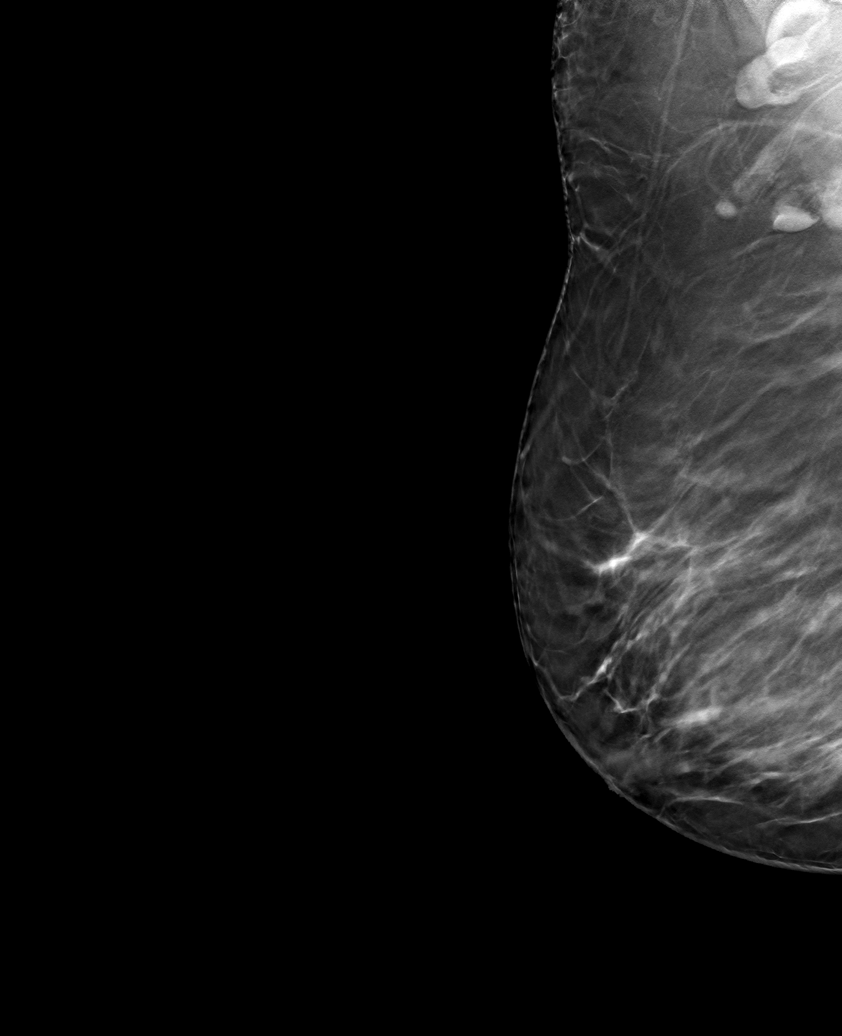

[6 of 30 positions shown; findings below may reference images not displayed]

ACR Breast Density Category b: There are scattered areas of
fibroglandular density.
FINDINGS: There are no findings suspicious for malignancy. Images were
processed with CAD.
IMPRESSION: No mammographic evidence of malignancy. A result letter of this
screening mammogram will be mailed directly to the patient.

RECOMMENDATION:
Screening mammogram in one year. (Code:[id number])

BI-RADS CATEGORY  1: Negative.

## 2020-11-17 ENCOUNTER — Ambulatory Visit: Payer: Medicare HMO

## 2020-11-20 ENCOUNTER — Ambulatory Visit: Payer: Medicare HMO | Admitting: Podiatry

## 2020-11-21 ENCOUNTER — Other Ambulatory Visit: Payer: Self-pay

## 2020-11-21 ENCOUNTER — Ambulatory Visit: Payer: Medicare HMO | Attending: Critical Care Medicine | Admitting: Critical Care Medicine

## 2020-11-28 ENCOUNTER — Ambulatory Visit (INDEPENDENT_AMBULATORY_CARE_PROVIDER_SITE_OTHER): Payer: Medicare HMO | Admitting: Podiatry

## 2020-11-28 ENCOUNTER — Other Ambulatory Visit: Payer: Self-pay

## 2020-11-28 ENCOUNTER — Encounter: Payer: Self-pay | Admitting: Podiatry

## 2020-11-28 DIAGNOSIS — B351 Tinea unguium: Secondary | ICD-10-CM | POA: Diagnosis not present

## 2020-11-28 DIAGNOSIS — M79675 Pain in left toe(s): Secondary | ICD-10-CM | POA: Diagnosis not present

## 2020-11-28 DIAGNOSIS — M79674 Pain in right toe(s): Secondary | ICD-10-CM

## 2020-11-28 DIAGNOSIS — E1142 Type 2 diabetes mellitus with diabetic polyneuropathy: Secondary | ICD-10-CM | POA: Diagnosis not present

## 2020-11-28 NOTE — Progress Notes (Signed)
This patient returns to my office for at risk foot care.  This patient requires this care by a professional since this patient will be at risk due to having diabetes.  This patient is unable to cut nails herself since the patient cannot reach her nails.These nails are painful walking and wearing shoes.  This patient presents for at risk foot care today.  General Appearance  Alert, conversant and in no acute stress.  Vascular  Dorsalis pedis and posterior tibial  pulses are weakly palpable  bilaterally.  Capillary return is within normal limits  Bilaterally.Cold feet  B/L.  bilaterally.  Neurologic  Senn-Weinstein monofilament wire test diminished   bilaterally. Muscle power within normal limits bilaterally.  Nails Thick disfigured discolored nails with subungual debris  from hallux to fifth toes bilaterally.  Pincer nails. No evidence of bacterial infection or drainage bilaterally.  Orthopedic  No limitations of motion  feet .  No crepitus or effusions noted.  No bony pathology or digital deformities noted.  Skin  normotropic skin with no porokeratosis noted bilaterally.  No signs of infections or ulcers noted.     Onychomycosis  Pain in right toes  Pain in left toes  Consent was obtained for treatment procedures.   Mechanical debridement of nails 1-5  bilaterally performed with a nail nipper.  Filed with dremel without incident.     Return office visit    3 months                  Told patient to return for periodic foot care and evaluation due to potential at risk complications.   Abdoulaye Drum DPM  

## 2020-12-08 ENCOUNTER — Inpatient Hospital Stay: Admission: RE | Admit: 2020-12-08 | Payer: Medicare HMO | Source: Ambulatory Visit

## 2020-12-13 ENCOUNTER — Other Ambulatory Visit: Payer: Self-pay

## 2020-12-13 ENCOUNTER — Ambulatory Visit
Admission: RE | Admit: 2020-12-13 | Discharge: 2020-12-13 | Disposition: A | Discharge status: Home / Self Care | Payer: Medicare HMO | Source: Ambulatory Visit | Attending: Family Medicine | Admitting: Family Medicine

## 2020-12-13 DIAGNOSIS — Z1231 Encounter for screening mammogram for malignant neoplasm of breast: Secondary | ICD-10-CM

## 2020-12-22 ENCOUNTER — Ambulatory Visit (INDEPENDENT_AMBULATORY_CARE_PROVIDER_SITE_OTHER): Payer: Medicare HMO

## 2020-12-22 DIAGNOSIS — I442 Atrioventricular block, complete: Secondary | ICD-10-CM | POA: Diagnosis not present

## 2020-12-24 LAB — CUP PACEART REMOTE DEVICE CHECK
Battery Remaining Longevity: 150 mo
Battery Voltage: 3.17 V
Brady Statistic AP VP Percent: 0.5 %
Brady Statistic AP VS Percent: 0 %
Brady Statistic AS VP Percent: 99 %
Brady Statistic AS VS Percent: 0.5 %
Brady Statistic RA Percent Paced: 0.59 %
Brady Statistic RV Percent Paced: 99.5 %
Date Time Interrogation Session: 20220922213313
Implantable Lead Implant Date: 20220623
Implantable Lead Implant Date: 20220623
Implantable Lead Location: 753859
Implantable Lead Location: 753860
Implantable Lead Model: 5076
Implantable Lead Model: 5076
Implantable Pulse Generator Implant Date: 20220623
Lead Channel Impedance Value: 361 Ohm
Lead Channel Impedance Value: 437 Ohm
Lead Channel Impedance Value: 532 Ohm
Lead Channel Impedance Value: 684 Ohm
Lead Channel Pacing Threshold Amplitude: 1 V
Lead Channel Pacing Threshold Amplitude: 1.5 V
Lead Channel Pacing Threshold Pulse Width: 0.4 ms
Lead Channel Pacing Threshold Pulse Width: 0.4 ms
Lead Channel Sensing Intrinsic Amplitude: 3.125 mV
Lead Channel Sensing Intrinsic Amplitude: 3.125 mV
Lead Channel Sensing Intrinsic Amplitude: 4.5 mV
Lead Channel Sensing Intrinsic Amplitude: 4.5 mV
Lead Channel Setting Pacing Amplitude: 2 V
Lead Channel Setting Pacing Amplitude: 3 V
Lead Channel Setting Pacing Pulse Width: 0.4 ms
Lead Channel Setting Sensing Sensitivity: 1.2 mV

## 2020-12-27 NOTE — Progress Notes (Signed)
Remote pacemaker transmission.   

## 2021-01-04 ENCOUNTER — Encounter: Payer: Medicare HMO | Admitting: Cardiology

## 2021-01-04 ENCOUNTER — Encounter: Payer: Self-pay | Admitting: Cardiology

## 2021-01-04 ENCOUNTER — Ambulatory Visit (INDEPENDENT_AMBULATORY_CARE_PROVIDER_SITE_OTHER): Payer: Medicare HMO | Admitting: Cardiology

## 2021-01-04 ENCOUNTER — Other Ambulatory Visit: Payer: Self-pay

## 2021-01-04 VITALS — BP 112/62 | HR 80 | Ht 62.0 in | Wt 221.2 lb

## 2021-01-04 DIAGNOSIS — Z950 Presence of cardiac pacemaker: Secondary | ICD-10-CM | POA: Diagnosis not present

## 2021-01-04 DIAGNOSIS — I442 Atrioventricular block, complete: Secondary | ICD-10-CM | POA: Diagnosis not present

## 2021-01-04 NOTE — Patient Instructions (Signed)
Medication Instructions:  Your physician recommends that you continue on your current medications as directed. Please refer to the Current Medication list given to you today.  *If you need a refill on your cardiac medications before your next appointment, please call your pharmacy*   Lab Work: None ordered.  If you have labs (blood work) drawn today and your tests are completely normal, you will receive your results only by: [place] (if you have MyChart) OR A paper copy in the mail If you have any lab test that is abnormal or we need to change your treatment, we will call you to review the results.   Testing/Procedures: None ordered.    Follow-Up: At [facility], you and your health needs are our priority.  As part of our continuing mission to provide you with exceptional heart care, we have created designated Provider Care Teams.  These Care Teams include your primary Cardiologist (physician) and Advanced Practice Providers (APPs -  Physician Assistants and Nurse Practitioners) who all work together to provide you with the care you need, when you need it.  We recommend signing up for the patient portal called "MyChart".  Sign up information is provided on this After Visit Summary.  MyChart is used to connect with patients for Virtual Visits (Telemedicine).  Patients are able to view lab/test results, encounter notes, upcoming appointments, etc.  Non-urgent messages can be sent to your provider as well.   To learn more about what you can do with MyChart, go to [email or web address].    Your next appointment:   9 months  The format for your next appointment:   In Person  Provider:   Allegra Lai, MD

## 2021-01-04 NOTE — Progress Notes (Signed)
Electrophysiology Office Note   Date:  01/04/2021   ID:  Jacqueline Simon, DOB 1951/01/10, MRN [id number]  PCP:  Roselee Nova, MD  Cardiologist:   Primary Electrophysiologist:  Carrianne Hyun Meredith Leeds, MD    Chief Complaint: pacemaker   History of Present Illness: Jacqueline Simon is a 70 y.o. female who is being seen today for the evaluation of complete AV block at the request of Gaynelle Arabian, MD. Presenting today for electrophysiology evaluation.  She has a history significant for hypertension, hyperlipidemia, diabetes, obesity.  She presented to the hospital 09/21/2019 when she was found to be bradycardic.  She was noted to be in complete heart block.  She has now status post Medtronic dual-chamber pacemaker implanted 09/21/2020.  Today, she denies symptoms of palpitations, chest pain, shortness of breath, orthopnea, PND, lower extremity edema, claudication, dizziness, presyncope, syncope, bleeding, or neurologic sequela. The patient is tolerating medications without difficulties.  Since her pacemaker was implanted she has done well.  She has had no chest pain or shortness of breath.  She is able to do all of her daily activities without restriction.  She has much more energy and quite a bit less shortness of breath since her pacemaker was implanted.   Past Medical History:  Diagnosis Date   Asthma    Diabetes mellitus without complication ([place])    Hypercholesteremia    Hypertension    Past Surgical History:  Procedure Laterality Date   PACEMAKER IMPLANT N/A 09/21/2020   Procedure: PACEMAKER IMPLANT;  Surgeon: Constance Haw, MD;  Location: [facility];  Service: Cardiovascular;  Laterality: N/A;     Current Outpatient Medications  Medication Sig Dispense Refill   albuterol (PROVENTIL HFA;VENTOLIN HFA) 108 (90 BASE) MCG/ACT inhaler Inhale 1-2 puffs into the lungs every 4 (four) hours as needed for wheezing or shortness of breath.     aspirin EC 81 MG tablet Take 81  mg by mouth daily.     cetirizine (ZYRTEC) 10 MG tablet Take 10 mg by mouth daily.     fluticasone (FLOVENT HFA) 220 MCG/ACT inhaler Inhale 1 puff into the lungs in the morning and at bedtime.     glimepiride (AMARYL) 1 MG tablet Take 1 mg by mouth daily with breakfast.     losartan-hydrochlorothiazide (HYZAAR) 100-25 MG tablet Take 1 tablet by mouth daily.     metFORMIN (GLUCOPHAGE-XR) 500 MG 24 hr tablet Take 1,000 mg by mouth 2 (two) times daily.   0   rosuvastatin (CRESTOR) 10 MG tablet Take 10 mg by mouth daily.     No current facility-administered medications for this visit.    Allergies:   Lisinopril-hydrochlorothiazide, Tramadol, and Penicillins   Social History:  The patient  reports that she has quit smoking. Her smoking use included cigarettes. She has never used smokeless tobacco. She reports that she does not drink alcohol and does not use drugs.   Family History:  The patient's family history includes Breast cancer in her daughter and mother.    ROS:  Please see the history of present illness.   Otherwise, review of systems is positive for none.   All other systems are reviewed and negative.    PHYSICAL EXAM: VS:  BP 112/62   Pulse 80   Ht 5\' 2"  (1.575 m)   Wt 221 lb 3.2 oz (100.3 kg)   SpO2 98%   BMI 40.46 kg/m  , BMI Body mass index is 40.46 kg/m. GEN: Well nourished, well  developed, in no acute distress  HEENT: normal  Neck: no JVD, carotid bruits, or masses Cardiac: RRR; no murmurs, rubs, or gallops,no edema  Respiratory:  clear to auscultation bilaterally, normal work of breathing GI: soft, nontender, nondistended, + BS MS: no deformity or atrophy  Skin: warm and dry, device pocket is well healed Neuro:  Strength and sensation are intact Psych: euthymic mood, full affect  EKG:  EKG is ordered today. Personal review of the ekg ordered shows sinus rhythm, V pacing  Device interrogation is reviewed today in detail.  See PaceArt for details.   Recent  Labs: 09/20/2020: B Natriuretic Peptide 231.9; TSH 4.295 09/21/2020: Magnesium 2.0 09/22/2020: ALT 12; BUN 9; Creatinine, Ser 0.69; Hemoglobin 11.7; Platelets 253; Potassium 4.5; Sodium 139    Lipid Panel     Component Value Date/Time   CHOL 167 11/20/2009 2000   TRIG 107 11/20/2009 2000   HDL 39 (L) 11/20/2009 2000   CHOLHDL 4.3 Ratio 11/20/2009 2000   VLDL 21 11/20/2009 2000   LDLCALC 107 (H) 11/20/2009 2000     Wt Readings from Last 3 Encounters:  01/04/21 221 lb 3.2 oz (100.3 kg)  09/22/20 226 lb 3.1 oz (102.6 kg)  02/20/14 205 lb (93 kg)      Other studies Reviewed: Additional studies/ records that were reviewed today include: TTE 09/21/20  Review of the above records today demonstrates:   1. Limited study for LV function; all images not obtained.   2. Left ventricular ejection fraction, by estimation, is 60 to 65%. The  left ventricle has normal function. The left ventricle has no regional  wall motion abnormalities. There is moderate left ventricular hypertrophy.  Left ventricular diastolic  parameters are indeterminate.   3. Right ventricular systolic function is mildly reduced. The right  ventricular size is normal.   4. The mitral valve is normal in structure. Mild mitral valve  regurgitation. No evidence of mitral stenosis.   5. The aortic valve was not assessed. Aortic valve regurgitation is not  visualized. No aortic stenosis is present.   6. Pulmonic valve regurgitation Not assessed.   7. The inferior vena cava is normal in size with greater than 50%  respiratory variability, suggesting right atrial pressure of 3 mmHg.    ASSESSMENT AND PLAN:  1.  Complete heart block: Status post Medtronic dual-chamber pacemaker implanted 09/21/2020.  Device functioning appropriately.  No changes at this time.  2.  Hypertension: Continue Hyzaar 100/25 mg daily per primary care  3.  Hyperlipidemia: Continue Crestor 10 mg per primary care.  Current medicines are reviewed  at length with the patient today.   The patient does not have concerns regarding her medicines.  The following changes were made today:  none  Labs/ tests ordered today include:  Orders Placed This Encounter  Procedures   EKG 12-Lead     Disposition:   FU with Freda Jaquith 9 months  Signed, Leathia Farnell Meredith Leeds, MD  01/04/2021 10:15 AM     [address] [place] [address] [phone] (office) [phone] (fax)

## 2021-03-06 ENCOUNTER — Ambulatory Visit: Payer: Medicare HMO | Admitting: Podiatry

## 2021-03-16 ENCOUNTER — Ambulatory Visit: Payer: Medicare HMO | Admitting: Podiatry

## 2021-03-16 ENCOUNTER — Ambulatory Visit (INDEPENDENT_AMBULATORY_CARE_PROVIDER_SITE_OTHER): Payer: Medicare HMO | Admitting: Podiatry

## 2021-03-16 ENCOUNTER — Other Ambulatory Visit: Payer: Self-pay

## 2021-03-16 DIAGNOSIS — B351 Tinea unguium: Secondary | ICD-10-CM

## 2021-03-16 DIAGNOSIS — M79674 Pain in right toe(s): Secondary | ICD-10-CM

## 2021-03-16 DIAGNOSIS — E1142 Type 2 diabetes mellitus with diabetic polyneuropathy: Secondary | ICD-10-CM

## 2021-03-16 DIAGNOSIS — M79675 Pain in left toe(s): Secondary | ICD-10-CM

## 2021-03-22 ENCOUNTER — Encounter: Payer: Self-pay | Admitting: Podiatry

## 2021-03-22 NOTE — Progress Notes (Signed)
°  Subjective:  Patient ID: Jacqueline Simon, female    DOB: 02-05-1951,  MRN: [id number]  Jacqueline Simon presents to clinic today for preventative diabetic foot care and painful elongated mycotic toenails 1-5 bilaterally which are tender when wearing enclosed shoe gear. Pain is relieved with periodic professional debridement.  Patient does not monitor blood glucose daily.  PCP is Roselee Nova, MD.  Allergies  Allergen Reactions   Lisinopril-Hydrochlorothiazide     Other reaction(s): felt bad   Tramadol     Other reaction(s): nausea   Penicillins Rash    Review of Systems: Negative except as noted in the HPI. Objective:   Constitutional [name] is a pleasant 70 y.o. African American female, morbidly obese in NAD. AAO x 3.   Vascular CFT <3 seconds b/l LE. Faintly palpable pedal pulses b/l LE. Pedal hair absent b/l LE. Skin temperature gradient WNL b/l. No pain with calf compression b/l. No edema b/l LE. No cyanosis or clubbing noted b/l LE.  Neurologic Normal speech. Oriented to person, place, and time. Protective sensation diminished with 10g monofilament b/l.  Dermatologic Pedal integument with normal turgor, texture and tone b/l LE. No open wounds b/l. No interdigital macerations b/l. Toenails 1-5 b/l elongated, thickened, discolored with subungual debris. +Tenderness with dorsal palpation of nailplates. No hyperkeratotic or porokeratotic lesions present.  Orthopedic: Muscle strength 5/5 to all lower extremity muscle groups bilaterally. No pain, crepitus or joint limitation noted with ROM bilateral LE.   Radiographs: None  Last A1c:  Hemoglobin A1C Latest Ref Rng & Units 09/20/2020  HGBA1C 4.8 - 5.6 % 7.7(H)  Some recent data might be hidden    Assessment:   1. Pain due to onychomycosis of toenails of both feet   2. Diabetic polyneuropathy associated with type 2 diabetes mellitus ([place])    Plan:  Patient was evaluated and treated and all questions answered. Consent given  for treatment as described below: -Examined patient. -Continue foot and shoe inspections daily. Monitor blood glucose per PCP/Endocrinologist's recommendations. -Mycotic toenails 1-5 bilaterally were debrided in length and girth with sterile nail nippers and dremel without incident. -Patient/POA to call should there be question/concern in the interim.  Return in about 3 months (around 06/14/2021).  Marzetta Board, DPM

## 2021-03-23 ENCOUNTER — Ambulatory Visit (INDEPENDENT_AMBULATORY_CARE_PROVIDER_SITE_OTHER): Payer: Medicare HMO

## 2021-03-23 DIAGNOSIS — I442 Atrioventricular block, complete: Secondary | ICD-10-CM | POA: Diagnosis not present

## 2021-03-23 LAB — CUP PACEART REMOTE DEVICE CHECK
Battery Remaining Longevity: 146 mo
Battery Voltage: 3.13 V
Brady Statistic AP VP Percent: 1.05 %
Brady Statistic AP VS Percent: 0 %
Brady Statistic AS VP Percent: 98.58 %
Brady Statistic AS VS Percent: 0.37 %
Brady Statistic RA Percent Paced: 1.18 %
Brady Statistic RV Percent Paced: 99.63 %
Date Time Interrogation Session: 20221222232444
Implantable Lead Implant Date: 20220623
Implantable Lead Implant Date: 20220623
Implantable Lead Location: 753859
Implantable Lead Location: 753860
Implantable Lead Model: 5076
Implantable Lead Model: 5076
Implantable Pulse Generator Implant Date: 20220623
Lead Channel Impedance Value: 342 Ohm
Lead Channel Impedance Value: 399 Ohm
Lead Channel Impedance Value: 494 Ohm
Lead Channel Impedance Value: 627 Ohm
Lead Channel Pacing Threshold Amplitude: 0.875 V
Lead Channel Pacing Threshold Amplitude: 1.125 V
Lead Channel Pacing Threshold Pulse Width: 0.4 ms
Lead Channel Pacing Threshold Pulse Width: 0.4 ms
Lead Channel Sensing Intrinsic Amplitude: 2.875 mV
Lead Channel Sensing Intrinsic Amplitude: 2.875 mV
Lead Channel Sensing Intrinsic Amplitude: 3.375 mV
Lead Channel Sensing Intrinsic Amplitude: 3.375 mV
Lead Channel Setting Pacing Amplitude: 2 V
Lead Channel Setting Pacing Amplitude: 2.25 V
Lead Channel Setting Pacing Pulse Width: 0.4 ms
Lead Channel Setting Sensing Sensitivity: 1.2 mV

## 2021-04-03 NOTE — Progress Notes (Signed)
Remote pacemaker transmission.   

## 2021-05-07 ENCOUNTER — Ambulatory Visit: Payer: Self-pay

## 2021-05-07 NOTE — Telephone Encounter (Signed)
°  Chief Complaint: Vaccine question Symptoms: NA Frequency: NA Pertinent Negatives: NA Disposition: [] ED /[] Urgent Care (no appt availability in office) / [] Appointment(In office/virtual)/ []  [facility]/ [x] Home Care/ [] Refused Recommended Disposition /[] [facility]/ []  Follow-up with PCP Additional Notes: Pt had J&J in 05/2019 then had a booster at drug store, she was unable to remember when but has been a long while. Advised pt she could be able to get updated booster but can follow up with drug store to schedule vaccine or schedule through [employer].    Message from Luciana Axe sent at 05/07/2021  9:25 AM EST  Pt is calling to ask how many COVID Shot is. Pt has received one J&J shot. Please advise    Reason for Disposition  COVID-19 vaccine, Frequently Asked Questions (FAQs)  Answer Assessment - Initial Assessment Questions 1. MAIN CONCERN OR SYMPTOM:  "What is your main concern right now?" "What question do you have?" "What's the main symptom you're worried about?" (e.g., fever, pain, redness, swelling)     Wanting to know if she can have another booster 2. VACCINE: "What vaccination did you receive?" (e.g., none; AstraZeneca, J&J, [place], other) "Is this your first, second shot, or booster?" (e.g., first, second, booster)     J&J back in 2021  Protocols used: Coronavirus (COVID-19) Vaccine Questions and Reactions-A-AH

## 2021-06-20 ENCOUNTER — Ambulatory Visit: Payer: Medicare HMO | Admitting: Podiatry

## 2021-06-22 ENCOUNTER — Ambulatory Visit (INDEPENDENT_AMBULATORY_CARE_PROVIDER_SITE_OTHER): Payer: Medicare HMO

## 2021-06-22 DIAGNOSIS — I442 Atrioventricular block, complete: Secondary | ICD-10-CM | POA: Diagnosis not present

## 2021-06-25 LAB — CUP PACEART REMOTE DEVICE CHECK
Battery Remaining Longevity: 142 mo
Battery Voltage: 3.07 V
Brady Statistic AP VP Percent: 0.61 %
Brady Statistic AP VS Percent: 0 %
Brady Statistic AS VP Percent: 99.31 %
Brady Statistic AS VS Percent: 0.08 %
Brady Statistic RA Percent Paced: 0.62 %
Brady Statistic RV Percent Paced: 99.92 %
Date Time Interrogation Session: 20230323234853
Implantable Lead Implant Date: 20220623
Implantable Lead Implant Date: 20220623
Implantable Lead Location: 753859
Implantable Lead Location: 753860
Implantable Lead Model: 5076
Implantable Lead Model: 5076
Implantable Pulse Generator Implant Date: 20220623
Lead Channel Impedance Value: 342 Ohm
Lead Channel Impedance Value: 380 Ohm
Lead Channel Impedance Value: 475 Ohm
Lead Channel Impedance Value: 532 Ohm
Lead Channel Pacing Threshold Amplitude: 0.75 V
Lead Channel Pacing Threshold Amplitude: 1.125 V
Lead Channel Pacing Threshold Pulse Width: 0.4 ms
Lead Channel Pacing Threshold Pulse Width: 0.4 ms
Lead Channel Sensing Intrinsic Amplitude: 20.625 mV
Lead Channel Sensing Intrinsic Amplitude: 20.625 mV
Lead Channel Sensing Intrinsic Amplitude: 3.25 mV
Lead Channel Sensing Intrinsic Amplitude: 3.25 mV
Lead Channel Setting Pacing Amplitude: 2 V
Lead Channel Setting Pacing Amplitude: 2.25 V
Lead Channel Setting Pacing Pulse Width: 0.4 ms
Lead Channel Setting Sensing Sensitivity: 1.2 mV

## 2021-06-27 NOTE — Progress Notes (Signed)
Remote pacemaker transmission.   

## 2021-07-16 ENCOUNTER — Encounter: Payer: Self-pay | Admitting: Podiatry

## 2021-07-16 ENCOUNTER — Ambulatory Visit (INDEPENDENT_AMBULATORY_CARE_PROVIDER_SITE_OTHER): Payer: Medicare HMO | Admitting: Podiatry

## 2021-07-16 DIAGNOSIS — B351 Tinea unguium: Secondary | ICD-10-CM

## 2021-07-16 DIAGNOSIS — E1142 Type 2 diabetes mellitus with diabetic polyneuropathy: Secondary | ICD-10-CM

## 2021-07-16 DIAGNOSIS — M79674 Pain in right toe(s): Secondary | ICD-10-CM

## 2021-07-16 DIAGNOSIS — M79675 Pain in left toe(s): Secondary | ICD-10-CM

## 2021-07-16 NOTE — Progress Notes (Signed)
This patient returns to my office for at risk foot care.  This patient requires this care by a professional since this patient will be at risk due to having diabetes.  This patient is unable to cut nails herself since the patient cannot reach her nails.These nails are painful walking and wearing shoes.  This patient presents for at risk foot care today.  General Appearance  Alert, conversant and in no acute stress.  Vascular  Dorsalis pedis and posterior tibial  pulses are weakly palpable  bilaterally.  Capillary return is within normal limits  Bilaterally.Cold feet  B/L.  bilaterally.  Neurologic  Senn-Weinstein monofilament wire test diminished   bilaterally. Muscle power within normal limits bilaterally.  Nails Thick disfigured discolored nails with subungual debris  from hallux to fifth toes bilaterally.  Pincer nails. No evidence of bacterial infection or drainage bilaterally.  Orthopedic  No limitations of motion  feet .  No crepitus or effusions noted.  No bony pathology or digital deformities noted.  Skin  normotropic skin with no porokeratosis noted bilaterally.  No signs of infections or ulcers noted.     Onychomycosis  Pain in right toes  Pain in left toes  Consent was obtained for treatment procedures.   Mechanical debridement of nails 1-5  bilaterally performed with a nail nipper.  Filed with dremel without incident.     Return office visit    3 months                  Told patient to return for periodic foot care and evaluation due to potential at risk complications.   Dusan Lipford DPM  

## 2021-09-21 ENCOUNTER — Ambulatory Visit (INDEPENDENT_AMBULATORY_CARE_PROVIDER_SITE_OTHER): Payer: Medicare HMO

## 2021-09-21 DIAGNOSIS — I442 Atrioventricular block, complete: Secondary | ICD-10-CM | POA: Diagnosis not present

## 2021-09-25 LAB — CUP PACEART REMOTE DEVICE CHECK
Battery Remaining Longevity: 140 mo
Battery Voltage: 3.04 V
Brady Statistic AP VP Percent: 0.63 %
Brady Statistic AP VS Percent: 0 %
Brady Statistic AS VP Percent: 99.3 %
Brady Statistic AS VS Percent: 0.06 %
Brady Statistic RA Percent Paced: 0.64 %
Brady Statistic RV Percent Paced: 99.94 %
Date Time Interrogation Session: 20230623050052
Implantable Lead Implant Date: 20220623
Implantable Lead Implant Date: 20220623
Implantable Lead Location: 753859
Implantable Lead Location: 753860
Implantable Lead Model: 5076
Implantable Lead Model: 5076
Implantable Pulse Generator Implant Date: 20220623
Lead Channel Impedance Value: 361 Ohm
Lead Channel Impedance Value: 418 Ohm
Lead Channel Impedance Value: 494 Ohm
Lead Channel Impedance Value: 608 Ohm
Lead Channel Pacing Threshold Amplitude: 0.75 V
Lead Channel Pacing Threshold Amplitude: 1.125 V
Lead Channel Pacing Threshold Pulse Width: 0.4 ms
Lead Channel Pacing Threshold Pulse Width: 0.4 ms
Lead Channel Sensing Intrinsic Amplitude: 25.75 mV
Lead Channel Sensing Intrinsic Amplitude: 25.75 mV
Lead Channel Sensing Intrinsic Amplitude: 3.75 mV
Lead Channel Sensing Intrinsic Amplitude: 3.75 mV
Lead Channel Setting Pacing Amplitude: 2 V
Lead Channel Setting Pacing Amplitude: 2.25 V
Lead Channel Setting Pacing Pulse Width: 0.4 ms
Lead Channel Setting Sensing Sensitivity: 1.2 mV

## 2021-09-26 NOTE — Progress Notes (Signed)
Remote pacemaker transmission.   

## 2021-10-11 ENCOUNTER — Other Ambulatory Visit: Payer: Self-pay | Admitting: Family Medicine

## 2021-10-11 DIAGNOSIS — E2839 Other primary ovarian failure: Secondary | ICD-10-CM

## 2021-10-17 ENCOUNTER — Encounter: Payer: Self-pay | Admitting: Podiatry

## 2021-10-17 ENCOUNTER — Ambulatory Visit (INDEPENDENT_AMBULATORY_CARE_PROVIDER_SITE_OTHER): Payer: Medicare HMO | Admitting: Podiatry

## 2021-10-17 DIAGNOSIS — B351 Tinea unguium: Secondary | ICD-10-CM | POA: Diagnosis not present

## 2021-10-17 DIAGNOSIS — M79675 Pain in left toe(s): Secondary | ICD-10-CM

## 2021-10-17 DIAGNOSIS — E1142 Type 2 diabetes mellitus with diabetic polyneuropathy: Secondary | ICD-10-CM

## 2021-10-17 DIAGNOSIS — M79674 Pain in right toe(s): Secondary | ICD-10-CM

## 2021-10-17 NOTE — Progress Notes (Signed)
This patient returns to my office for at risk foot care.  This patient requires this care by a professional since this patient will be at risk due to having diabetes.  This patient is unable to cut nails herself since the patient cannot reach her nails.These nails are painful walking and wearing shoes.  This patient presents for at risk foot care today.  General Appearance  Alert, conversant and in no acute stress.  Vascular  Dorsalis pedis and posterior tibial  pulses are weakly palpable  bilaterally.  Capillary return is within normal limits  Bilaterally.Cold feet  B/L.  bilaterally.  Neurologic  Senn-Weinstein monofilament wire test diminished   bilaterally. Muscle power within normal limits bilaterally.  Nails Thick disfigured discolored nails with subungual debris  from hallux to fifth toes bilaterally.  Pincer nails. No evidence of bacterial infection or drainage bilaterally.  Orthopedic  No limitations of motion  feet .  No crepitus or effusions noted.  No bony pathology or digital deformities noted.  Skin  normotropic skin with no porokeratosis noted bilaterally.  No signs of infections or ulcers noted.     Onychomycosis  Pain in right toes  Pain in left toes  Consent was obtained for treatment procedures.   Mechanical debridement of nails 1-5  bilaterally performed with a nail nipper.  Filed with dremel without incident.     Return office visit    3 months                  Told patient to return for periodic foot care and evaluation due to potential at risk complications.   Gardiner Barefoot DPM

## 2021-12-04 ENCOUNTER — Other Ambulatory Visit: Payer: Self-pay | Admitting: Family Medicine

## 2021-12-04 DIAGNOSIS — Z1231 Encounter for screening mammogram for malignant neoplasm of breast: Secondary | ICD-10-CM

## 2021-12-31 ENCOUNTER — Ambulatory Visit (INDEPENDENT_AMBULATORY_CARE_PROVIDER_SITE_OTHER): Payer: Medicare HMO

## 2021-12-31 DIAGNOSIS — I442 Atrioventricular block, complete: Secondary | ICD-10-CM

## 2022-01-01 ENCOUNTER — Ambulatory Visit
Admission: RE | Admit: 2022-01-01 | Discharge: 2022-01-01 | Disposition: A | Discharge status: Home / Self Care | Payer: Medicare HMO | Source: Ambulatory Visit | Attending: Family Medicine | Admitting: Family Medicine

## 2022-01-01 ENCOUNTER — Inpatient Hospital Stay: Admission: RE | Admit: 2022-01-01 | Payer: Medicare HMO | Source: Ambulatory Visit

## 2022-01-01 DIAGNOSIS — Z1231 Encounter for screening mammogram for malignant neoplasm of breast: Secondary | ICD-10-CM

## 2022-01-01 LAB — CUP PACEART REMOTE DEVICE CHECK
Battery Remaining Longevity: 112 mo
Battery Voltage: 3.02 V
Brady Statistic AP VP Percent: 0.91 %
Brady Statistic AP VS Percent: 0 %
Brady Statistic AS VP Percent: 98.96 %
Brady Statistic AS VS Percent: 0.13 %
Brady Statistic RA Percent Paced: 0.93 %
Brady Statistic RV Percent Paced: 99.87 %
Date Time Interrogation Session: 20231002121548
Implantable Lead Implant Date: 20220623
Implantable Lead Implant Date: 20220623
Implantable Lead Location: 753859
Implantable Lead Location: 753860
Implantable Lead Model: 5076
Implantable Lead Model: 5076
Implantable Pulse Generator Implant Date: 20220623
Lead Channel Impedance Value: 342 Ohm
Lead Channel Impedance Value: 342 Ohm
Lead Channel Impedance Value: 494 Ohm
Lead Channel Impedance Value: 570 Ohm
Lead Channel Pacing Threshold Amplitude: 1 V
Lead Channel Pacing Threshold Amplitude: 1.375 V
Lead Channel Pacing Threshold Pulse Width: 0.4 ms
Lead Channel Pacing Threshold Pulse Width: 0.4 ms
Lead Channel Sensing Intrinsic Amplitude: 17.625 mV
Lead Channel Sensing Intrinsic Amplitude: 17.625 mV
Lead Channel Sensing Intrinsic Amplitude: 3.125 mV
Lead Channel Sensing Intrinsic Amplitude: 3.125 mV
Lead Channel Setting Pacing Amplitude: 2 V
Lead Channel Setting Pacing Amplitude: 2.75 V
Lead Channel Setting Pacing Pulse Width: 0.4 ms
Lead Channel Setting Sensing Sensitivity: 1.2 mV

## 2022-01-06 IMAGING — DX DG CHEST 1V PORT
1 series · 1 of 1 positions shown · non-contrast
Comparison: CT 13 10

CLINICAL DATA: Bradycardia.  Chest pain.

EXAM:
PORTABLE CHEST 1 VIEW

[chest ap]
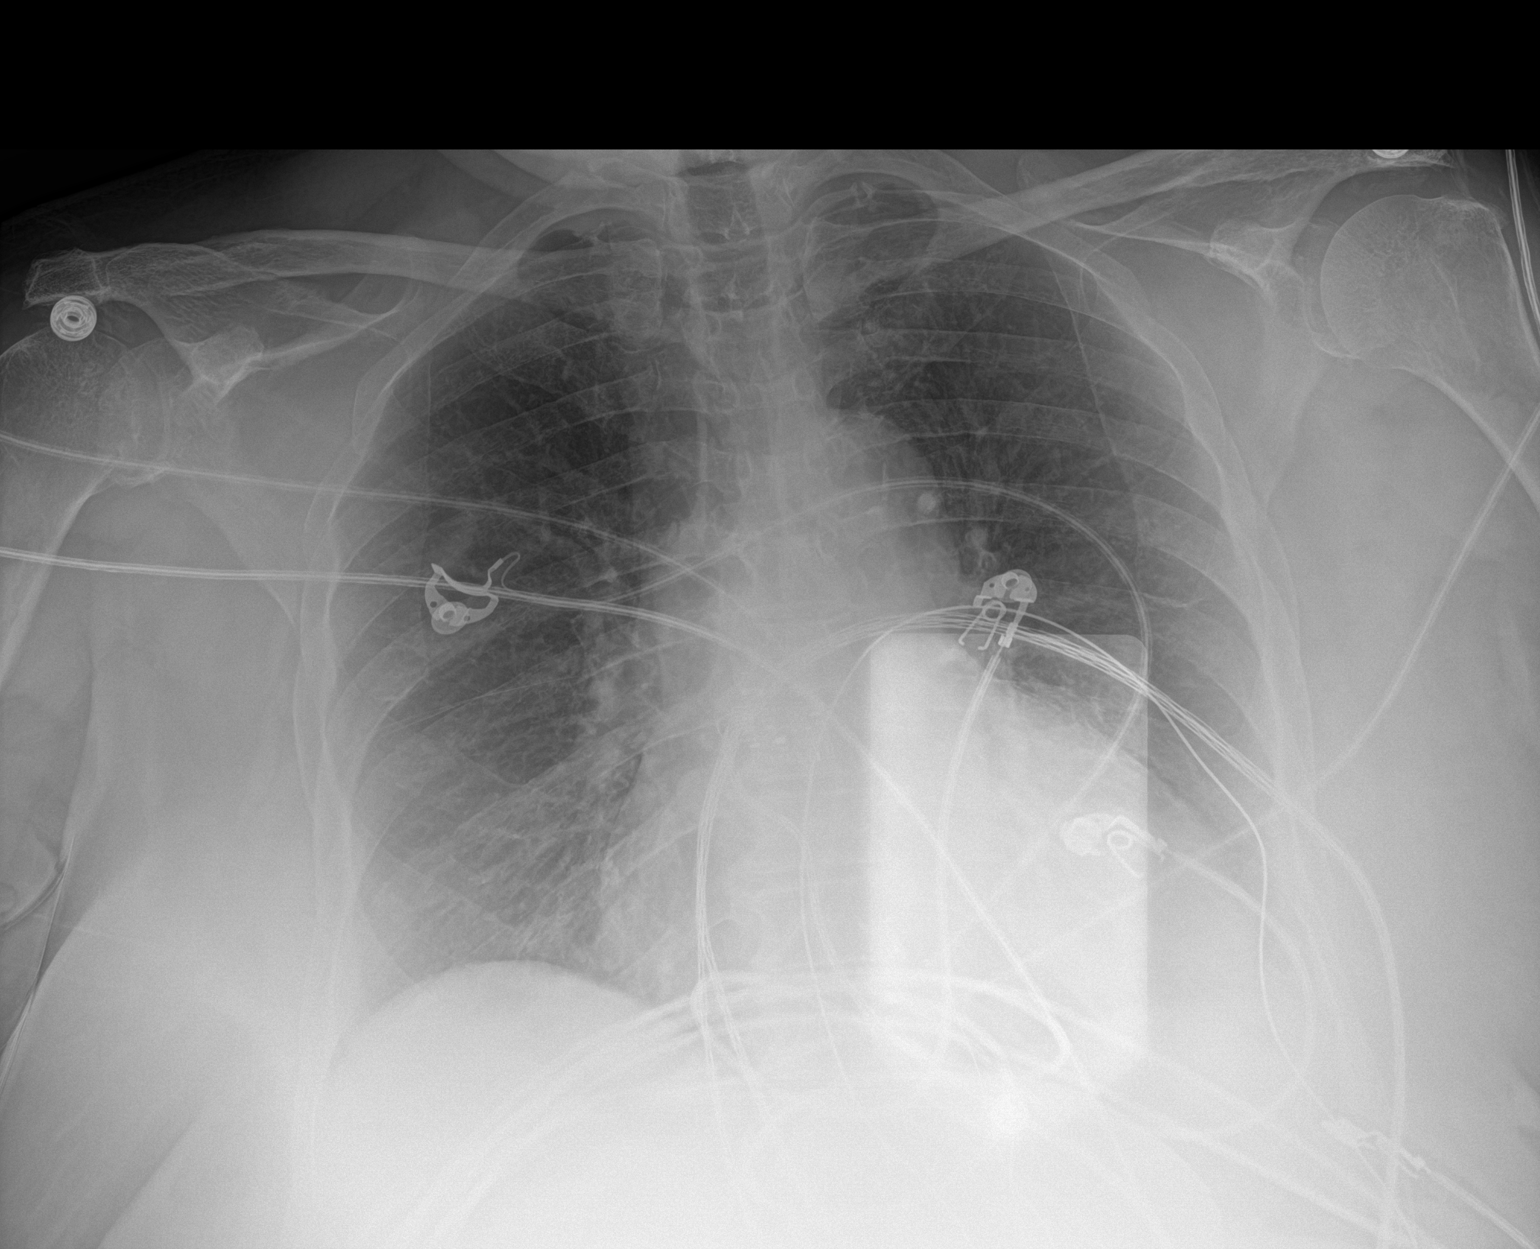

[1 of 1 positions shown; findings below may reference images not displayed]

FINDINGS: Mild cardiomegaly. No pulmonary edema. Left lung base partially
obscured by overlying monitoring devices. There is no right pleural
effusion. No confluent consolidation. No pneumothorax. No acute
osseous abnormalities are seen
IMPRESSION: Mild cardiomegaly without congestive failure.

## 2022-01-07 ENCOUNTER — Encounter: Payer: Self-pay | Admitting: Family Medicine

## 2022-01-16 NOTE — Progress Notes (Signed)
Remote pacemaker transmission.   

## 2022-01-21 ENCOUNTER — Ambulatory Visit: Payer: Medicare HMO | Attending: Cardiology | Admitting: Cardiology

## 2022-01-21 ENCOUNTER — Encounter: Payer: Self-pay | Admitting: Cardiology

## 2022-01-21 VITALS — BP 118/70 | HR 89 | Ht 62.0 in | Wt 222.2 lb

## 2022-01-21 DIAGNOSIS — I442 Atrioventricular block, complete: Secondary | ICD-10-CM

## 2022-01-21 NOTE — Progress Notes (Signed)
Electrophysiology Office Note   Date:  01/21/2022   ID:  Jacqueline Simon, DOB 1951/03/29, MRN [id number]  PCP:  Jacqueline Nova, MD  Cardiologist:   Primary Electrophysiologist:  Jacqueline Bischoff Meredith Leeds, MD    Chief Complaint: pacemaker   History of Present Illness: Jacqueline Simon is a 71 y.o. female who is being seen today for the evaluation of complete AV block at the request of Jacqueline Nova, MD. Presenting today for electrophysiology evaluation.  She has a history significant for hypertension, hyperlipidemia, diabetes, obesity.  She presented to hospital 09/21/2019 when she was found to be bradycardic.  She was noted to be in complete heart block.  She is post Medtronic dual-chamber pacemaker implanted 09/21/2020.  Today, denies symptoms of palpitations, chest pain, shortness of breath, orthopnea, PND, lower extremity edema, claudication, dizziness, presyncope, syncope, bleeding, or neurologic sequela. The patient is tolerating medications without difficulties.      Past Medical History:  Diagnosis Date   Asthma    Diabetes mellitus without complication ([place])    Hypercholesteremia    Hypertension    Past Surgical History:  Procedure Laterality Date   PACEMAKER IMPLANT N/A 09/21/2020   Procedure: PACEMAKER IMPLANT;  Surgeon: Constance Haw, MD;  Location: [facility];  Service: Cardiovascular;  Laterality: N/A;     Current Outpatient Medications  Medication Sig Dispense Refill   albuterol (PROVENTIL HFA;VENTOLIN HFA) 108 (90 BASE) MCG/ACT inhaler Inhale 1-2 puffs into the lungs every 4 (four) hours as needed for wheezing or shortness of breath.     aspirin EC 81 MG tablet Take 81 mg by mouth daily.     cetirizine (ZYRTEC) 10 MG tablet Take 10 mg by mouth daily.     fluticasone (FLOVENT HFA) 220 MCG/ACT inhaler Inhale 1 puff into the lungs in the morning and at bedtime.     glimepiride (AMARYL) 1 MG tablet Take 1 mg by mouth daily with breakfast.     JANUVIA  100 MG tablet Take 100 mg by mouth daily.     losartan-hydrochlorothiazide (HYZAAR) 100-25 MG tablet Take 1 tablet by mouth daily.     metFORMIN (GLUCOPHAGE) 500 MG tablet Take by mouth.     metFORMIN (GLUCOPHAGE) 500 MG tablet Take by mouth.     metFORMIN (GLUCOPHAGE-XR) 500 MG 24 hr tablet Take 1,000 mg by mouth 2 (two) times daily.   0   PEPCID 20 MG tablet Take 20 mg by mouth daily.     rosuvastatin (CRESTOR) 10 MG tablet Take 10 mg by mouth daily.     No current facility-administered medications for this visit.    Allergies:   Lisinopril-hydrochlorothiazide, Tramadol, and Penicillins   Social History:  The patient  reports that she has quit smoking. Her smoking use included cigarettes. She has never used smokeless tobacco. She reports that she does not drink alcohol and does not use drugs.   Family History:  The patient's family history includes Breast cancer in her daughter and mother.    ROS:  Please see the history of present illness.   Otherwise, review of systems is positive for none.   All other systems are reviewed and negative.   PHYSICAL EXAM: VS:  BP 118/70   Pulse 89   Ht '5\' 2"'$  (1.575 m)   Wt 222 lb 3.2 oz (100.8 kg)   SpO2 97%   BMI 40.64 kg/m  , BMI Body mass index is 40.64 kg/m. GEN: Well nourished, well developed,  in no acute distress  HEENT: normal  Neck: no JVD, carotid bruits, or masses Cardiac: RRR; no murmurs, rubs, or gallops,no edema  Respiratory:  clear to auscultation bilaterally, normal work of breathing GI: soft, nontender, nondistended, + BS MS: no deformity or atrophy  Skin: warm and dry, device site well healed Neuro:  Strength and sensation are intact Psych: euthymic mood, full affect  EKG:  EKG is ordered today. Personal review of the ekg ordered shows sinus rhythm, ventricular paced  Personal review of the device interrogation today. Results in [place]: No results found for requested labs within last 365 days.     Lipid Panel     Component Value Date/Time   CHOL 167 11/20/2009 2000   TRIG 107 11/20/2009 2000   HDL 39 (L) 11/20/2009 2000   CHOLHDL 4.3 Ratio 11/20/2009 2000   VLDL 21 11/20/2009 2000   LDLCALC 107 (H) 11/20/2009 2000     Wt Readings from Last 3 Encounters:  01/21/22 222 lb 3.2 oz (100.8 kg)  01/04/21 221 lb 3.2 oz (100.3 kg)  09/22/20 226 lb 3.1 oz (102.6 kg)      Other studies Reviewed: Additional studies/ records that were reviewed today include: TTE 09/21/20  Review of the above records today demonstrates:   1. Limited study for LV function; all images not obtained.   2. Left ventricular ejection fraction, by estimation, is 60 to 65%. The  left ventricle has normal function. The left ventricle has no regional  wall motion abnormalities. There is moderate left ventricular hypertrophy.  Left ventricular diastolic  parameters are indeterminate.   3. Right ventricular systolic function is mildly reduced. The right  ventricular size is normal.   4. The mitral valve is normal in structure. Mild mitral valve  regurgitation. No evidence of mitral stenosis.   5. The aortic valve was not assessed. Aortic valve regurgitation is not  visualized. No aortic stenosis is present.   6. Pulmonic valve regurgitation Not assessed.   7. The inferior vena cava is normal in size with greater than 50%  respiratory variability, suggesting right atrial pressure of 3 mmHg.    ASSESSMENT AND PLAN:  1.  Complete heart block: Status post Medtronic dual-chamber pacemaker implanted 09/21/2020.  Device function appropriately.  No changes at this time.  2.  Hypertension: Currently well controlled.  Continue Hyzaar per primary physician  3.  Hyperlipidemia: Continue Crestor per primary physician.   Current medicines are reviewed at length with the patient today.   The patient does not have concerns regarding her medicines.  The following changes were made today:  none  Labs/ tests ordered  today include:  Orders Placed This Encounter  Procedures   EKG 12-Lead     Disposition:   FU 12 months  Signed, Tenzin Pavon Meredith Leeds, MD  01/21/2022 4:21 PM     [place] [address] [phone] (office) [phone] (fax)

## 2022-01-23 ENCOUNTER — Encounter: Payer: Self-pay | Admitting: Podiatry

## 2022-01-23 ENCOUNTER — Ambulatory Visit (INDEPENDENT_AMBULATORY_CARE_PROVIDER_SITE_OTHER): Payer: Medicare HMO | Admitting: Podiatry

## 2022-01-23 DIAGNOSIS — B351 Tinea unguium: Secondary | ICD-10-CM

## 2022-01-23 DIAGNOSIS — E1142 Type 2 diabetes mellitus with diabetic polyneuropathy: Secondary | ICD-10-CM

## 2022-01-23 DIAGNOSIS — M79675 Pain in left toe(s): Secondary | ICD-10-CM | POA: Diagnosis not present

## 2022-01-23 DIAGNOSIS — M79674 Pain in right toe(s): Secondary | ICD-10-CM

## 2022-01-23 NOTE — Progress Notes (Signed)
This patient returns to my office for at risk foot care.  This patient requires this care by a professional since this patient will be at risk due to having diabetes.  This patient is unable to cut nails herself since the patient cannot reach her nails.These nails are painful walking and wearing shoes.  This patient presents for at risk foot care today.  General Appearance  Alert, conversant and in no acute stress.  Vascular  Dorsalis pedis and posterior tibial  pulses are weakly palpable  bilaterally.  Capillary return is within normal limits  Bilaterally.Cold feet  B/L.  bilaterally.  Neurologic  Senn-Weinstein monofilament wire test diminished   bilaterally. Muscle power within normal limits bilaterally.  Nails Thick disfigured discolored nails with subungual debris  from hallux to fifth toes bilaterally.  Pincer nails. No evidence of bacterial infection or drainage bilaterally.  Orthopedic  No limitations of motion  feet .  No crepitus or effusions noted.  No bony pathology or digital deformities noted.  Skin  normotropic skin with no porokeratosis noted bilaterally.  No signs of infections or ulcers noted.     Onychomycosis  Pain in right toes  Pain in left toes  Consent was obtained for treatment procedures.   Mechanical debridement of nails 1-5  bilaterally performed with a nail nipper.  Filed with dremel without incident.     Return office visit    3 months                  Told patient to return for periodic foot care and evaluation due to potential at risk complications.   Gardiner Barefoot DPM

## 2022-01-30 ENCOUNTER — Emergency Department (HOSPITAL_COMMUNITY)
Admission: EM | Admit: 2022-01-30 | Discharge: 2022-01-30 | Disposition: A | Discharge status: Home / Self Care | Payer: Medicare HMO | Attending: Emergency Medicine | Admitting: Emergency Medicine

## 2022-01-30 ENCOUNTER — Emergency Department (HOSPITAL_COMMUNITY): Payer: Medicare HMO

## 2022-01-30 ENCOUNTER — Encounter (HOSPITAL_COMMUNITY): Payer: Self-pay | Admitting: Emergency Medicine

## 2022-01-30 DIAGNOSIS — Z7984 Long term (current) use of oral hypoglycemic drugs: Secondary | ICD-10-CM | POA: Diagnosis not present

## 2022-01-30 DIAGNOSIS — R112 Nausea with vomiting, unspecified: Secondary | ICD-10-CM | POA: Insufficient documentation

## 2022-01-30 DIAGNOSIS — R109 Unspecified abdominal pain: Secondary | ICD-10-CM | POA: Diagnosis present

## 2022-01-30 DIAGNOSIS — J45909 Unspecified asthma, uncomplicated: Secondary | ICD-10-CM | POA: Diagnosis not present

## 2022-01-30 DIAGNOSIS — E119 Type 2 diabetes mellitus without complications: Secondary | ICD-10-CM | POA: Insufficient documentation

## 2022-01-30 DIAGNOSIS — I10 Essential (primary) hypertension: Secondary | ICD-10-CM | POA: Diagnosis not present

## 2022-01-30 DIAGNOSIS — Z7982 Long term (current) use of aspirin: Secondary | ICD-10-CM | POA: Diagnosis not present

## 2022-01-30 DIAGNOSIS — Z950 Presence of cardiac pacemaker: Secondary | ICD-10-CM | POA: Diagnosis not present

## 2022-01-30 DIAGNOSIS — Z79899 Other long term (current) drug therapy: Secondary | ICD-10-CM | POA: Diagnosis not present

## 2022-01-30 LAB — CBC WITH DIFFERENTIAL/PLATELET
Abs Immature Granulocytes: 0.11 10*3/uL — ABNORMAL HIGH (ref 0.00–0.07)
Basophils Absolute: 0 10*3/uL (ref 0.0–0.1)
Basophils Relative: 0 %
Eosinophils Absolute: 0 10*3/uL (ref 0.0–0.5)
Eosinophils Relative: 0 %
HCT: 39 % (ref 36.0–46.0)
Hemoglobin: 12.2 g/dL (ref 12.0–15.0)
Immature Granulocytes: 1 %
Lymphocytes Relative: 12 %
Lymphs Abs: 1.3 10*3/uL (ref 0.7–4.0)
MCH: 30.8 pg (ref 26.0–34.0)
MCHC: 31.3 g/dL (ref 30.0–36.0)
MCV: 98.5 fL (ref 80.0–100.0)
Monocytes Absolute: 0.3 10*3/uL (ref 0.1–1.0)
Monocytes Relative: 3 %
Neutro Abs: 8.8 10*3/uL — ABNORMAL HIGH (ref 1.7–7.7)
Neutrophils Relative %: 84 %
Platelets: 317 10*3/uL (ref 150–400)
RBC: 3.96 MIL/uL (ref 3.87–5.11)
RDW: 13.9 % (ref 11.5–15.5)
WBC: 10.6 10*3/uL — ABNORMAL HIGH (ref 4.0–10.5)
nRBC: 0 % (ref 0.0–0.2)

## 2022-01-30 LAB — LACTIC ACID, PLASMA
Lactic Acid, Venous: 1.5 mmol/L (ref 0.5–1.9)
Lactic Acid, Venous: 1.5 mmol/L (ref 0.5–1.9)

## 2022-01-30 LAB — COMPREHENSIVE METABOLIC PANEL
ALT: 16 U/L (ref 0–44)
AST: 19 U/L (ref 15–41)
Albumin: 3.7 g/dL (ref 3.5–5.0)
Alkaline Phosphatase: 75 U/L (ref 38–126)
Anion gap: 12 (ref 5–15)
BUN: 13 mg/dL (ref 8–23)
CO2: 29 mmol/L (ref 22–32)
Calcium: 9.5 mg/dL (ref 8.9–10.3)
Chloride: 102 mmol/L (ref 98–111)
Creatinine, Ser: 0.7 mg/dL (ref 0.44–1.00)
GFR, Estimated: >60 mL/min (ref >60)
Glucose, Bld: 174 mg/dL — ABNORMAL HIGH (ref 70–99)
Potassium: 3.9 mmol/L (ref 3.5–5.1)
Sodium: 143 mmol/L (ref 135–145)
Total Bilirubin: 0.5 mg/dL (ref 0.3–1.2)
Total Protein: 7.9 g/dL (ref 6.5–8.1)

## 2022-01-30 LAB — LIPASE, BLOOD: Lipase: 30 U/L (ref 11–51)

## 2022-01-30 LAB — TROPONIN I (HIGH SENSITIVITY)
Troponin I (High Sensitivity): 14 ng/L (ref <18)
Troponin I (High Sensitivity): 10 ng/L (ref <18)

## 2022-01-30 LAB — URINALYSIS, ROUTINE W REFLEX MICROSCOPIC
Bilirubin Urine: NEGATIVE
Glucose, UA: NEGATIVE mg/dL
Hgb urine dipstick: NEGATIVE
Ketones, ur: NEGATIVE mg/dL
Leukocytes,Ua: NEGATIVE
Nitrite: NEGATIVE
Protein, ur: NEGATIVE mg/dL
Specific Gravity, Urine: 1.04 — ABNORMAL HIGH (ref 1.005–1.030)
pH: 7 (ref 5.0–8.0)

## 2022-01-30 MED ORDER — LACTATED RINGERS IV BOLUS
1000.0000 mL | Freq: Once | INTRAVENOUS | Status: AC
  Administered 2022-01-30: 1000 mL via INTRAVENOUS

## 2022-01-30 MED ORDER — IOHEXOL 300 MG/ML  SOLN
80.0000 mL | Freq: Once | INTRAMUSCULAR | Status: AC | PRN
  Administered 2022-01-30: 80 mL via INTRAVENOUS

## 2022-01-30 MED ORDER — ONDANSETRON HCL 4 MG/2ML IJ SOLN
4.0000 mg | Freq: Once | INTRAMUSCULAR | Status: AC
  Administered 2022-01-30: 4 mg via INTRAVENOUS
  Filled 2022-01-30: qty 2

## 2022-01-30 NOTE — Discharge Instructions (Signed)
Your testing is reassuring.  No evidence of heart attack or blood clot in the lung.  No evidence of UTI.  Follow-up with your doctor to discuss changing her medication regimen to take them not at the same time.  Also make sure you eat while taking her medications. Return to the ED with exertional chest pain, pain associate with shortness of breath, nausea, vomiting, sweating, other concerns.

## 2022-01-30 NOTE — ED Triage Notes (Signed)
Patient BIB [facility] from home for evaluation of abdominal pain, nausea, and vomiting that started at approximately 0800 this morning. Patient denies chest pain, is alert, oriented, and in no apparent distress at this time. Patient received '4mg'$  zofran IM in left deltoid from EMS PTA.   BP 130/54 100% on room air HR 66 CBG 167

## 2022-01-30 NOTE — ED Provider Triage Note (Signed)
Emergency Medicine Provider Triage Evaluation Note  BRANNON DECAIRE , a 71 y.o. female  was evaluated in triage.  Pt complains of nausea and vomiting.  Patient reports that this morning she had 1 episode of nausea and vomiting after taking medications.  Patient reports that she typically takes medications on a full stomach however this morning only had 3 saltine crackers on her stomach prior to medications.  Patient states that she takes approximately 7 medications every morning.  Patient denies any diarrhea, fevers.  Patient was given Zofran in route with EMS and states she feels better.  No abdominal tenderness on examination.  Review of Systems  Positive:  Negative:   Physical Exam  BP (!) 160/64 (BP Location: Right Arm)   Pulse 60   Temp 97.9 F (36.6 C) (Oral)   Resp 16   SpO2 94%  Gen:   Awake, no distress   Resp:  Normal effort  MSK:   Moves extremities without difficulty  Other:  Abdomen soft and compressible  Medical Decision Making  Medically screening exam initiated at 1:20 PM.  Appropriate orders placed.  [name] was informed that the remainder of the evaluation will be completed by another provider, this initial triage assessment does not replace that evaluation, and the importance of remaining in the ED until their evaluation is complete.     Azucena Cecil, PA-C 01/30/22 1321

## 2022-01-30 NOTE — ED Notes (Signed)
Patient transported to CT 

## 2022-01-30 NOTE — ED Provider Notes (Signed)
[facility] EMERGENCY DEPARTMENT Provider Note   CSN: [id number] Arrival date & time: 01/30/22  1251     History  Chief Complaint  Patient presents with   Abdominal Pain    Jacqueline Simon is a 71 y.o. female.  Patient with a history of pacemaker, diabetes, asthma, hypertension, high cholesterol presenting with episode of nausea, vomiting abdominal pain this morning.  She believes this occurred after taking her medications on an empty stomach.  She felt well prior to this.  Shortly after taking her medication this morning she had nausea and vomiting x2 after having her medications only with crackers.  After this she developed pain to the center of her chest that radiated diffusely throughout her abdomen and back.  Symptoms resolved after about an hour and now she feels back to baseline.  No diarrhea, fever, cough, shortness of breath, pain with urination or blood in the urine.  No focal weakness, numbness or tingling.  Still Still appendix and gallbladder.  Still feels nauseated but having no chest pain or abdominal pain.  The history is provided by the patient and a relative.  Abdominal Pain Associated symptoms: chest pain, nausea and vomiting   Associated symptoms: no cough, no dysuria, no fever, no hematuria and no shortness of breath        Home Medications Prior to Admission medications   Medication Sig Start Date End Date Taking? Authorizing Provider  albuterol (PROVENTIL HFA;VENTOLIN HFA) 108 (90 BASE) MCG/ACT inhaler Inhale 1-2 puffs into the lungs every 4 (four) hours as needed for wheezing or shortness of breath.    [provider]  aspirin EC 81 MG tablet Take 81 mg by mouth daily.    [provider]  cetirizine (ZYRTEC) 10 MG tablet Take 10 mg by mouth daily.    [provider]  fluticasone (FLOVENT HFA) 220 MCG/ACT inhaler Inhale 1 puff into the lungs in the morning and at bedtime.    [provider]  glimepiride  (AMARYL) 1 MG tablet Take 1 mg by mouth daily with breakfast. 04/09/18   [provider]  JANUVIA 100 MG tablet Take 100 mg by mouth daily. 01/14/22   [provider]  losartan-hydrochlorothiazide (HYZAAR) 100-25 MG tablet Take 1 tablet by mouth daily. 01/10/19   [provider]  metFORMIN (GLUCOPHAGE) 500 MG tablet Take by mouth. 05/20/21   [provider]  metFORMIN (GLUCOPHAGE) 500 MG tablet Take by mouth. 11/23/21   [provider]  metFORMIN (GLUCOPHAGE-XR) 500 MG 24 hr tablet Take 1,000 mg by mouth 2 (two) times daily.  01/17/14   [provider]  PEPCID 20 MG tablet Take 20 mg by mouth daily. 01/02/22   [provider]  rosuvastatin (CRESTOR) 10 MG tablet Take 10 mg by mouth daily. 07/14/20   [provider]      Allergies    Lisinopril-hydrochlorothiazide, Tramadol, and Penicillins    Review of Systems   Review of Systems  Constitutional:  Positive for activity change and appetite change. Negative for fever.  HENT:  Positive for congestion.   Respiratory:  Positive for chest tightness. Negative for cough and shortness of breath.   Cardiovascular:  Positive for chest pain.  Gastrointestinal:  Positive for abdominal pain, nausea and vomiting.  Genitourinary:  Negative for dysuria and hematuria.  Musculoskeletal:  Positive for back pain. Negative for arthralgias and myalgias.  Neurological:  Positive for weakness. Negative for headaches.   all other systems are negative except as  noted in the HPI and PMH.    Physical Exam Updated Vital Signs BP (!) 134/102   Pulse 73   Temp 97.9 F (36.6 C) (Oral)   Resp 20   SpO2 98%  Physical Exam Vitals and nursing note reviewed.  Constitutional:      General: She is not in acute distress.    Appearance: She is well-developed.  HENT:     Head: Normocephalic and atraumatic.     Mouth/Throat:     Pharynx: No oropharyngeal exudate.  Eyes:     Conjunctiva/sclera:  Conjunctivae normal.     Pupils: Pupils are equal, round, and reactive to light.  Neck:     Comments: No meningismus. Cardiovascular:     Rate and Rhythm: Normal rate and regular rhythm.     Heart sounds: Normal heart sounds. No murmur heard. Pulmonary:     Effort: Pulmonary effort is normal. No respiratory distress.     Breath sounds: Normal breath sounds.  Abdominal:     Palpations: Abdomen is soft.     Tenderness: There is abdominal tenderness. There is no guarding or rebound.     Comments: Epigastric tenderness, no guarding or rebound  Musculoskeletal:        General: No tenderness. Normal range of motion.     Cervical back: Normal range of motion and neck supple.  Skin:    General: Skin is warm.  Neurological:     Mental Status: She is alert and oriented to person, place, and time.     Cranial Nerves: No cranial nerve deficit.     Motor: No abnormal muscle tone.     Coordination: Coordination normal.     Comments:  5/5 strength throughout. CN 2-12 intact.Equal grip strength.   Psychiatric:        Behavior: Behavior normal.     ED Results / Procedures / Treatments   Labs (all labs ordered are listed, but only abnormal results are displayed) Labs Reviewed  CBC WITH DIFFERENTIAL/PLATELET - Abnormal; Notable for the following components:      Result Value   WBC 10.6 (*)    Neutro Abs 8.8 (*)    Abs Immature Granulocytes 0.11 (*)    All other components within normal limits  COMPREHENSIVE METABOLIC PANEL - Abnormal; Notable for the following components:   Glucose, Bld 174 (*)    All other components within normal limits  URINALYSIS, ROUTINE W REFLEX MICROSCOPIC - Abnormal; Notable for the following components:   Specific Gravity, Urine 1.040 (*)    All other components within normal limits  LIPASE, BLOOD  LACTIC ACID, PLASMA  LACTIC ACID, PLASMA  TROPONIN I (HIGH SENSITIVITY)  TROPONIN I (HIGH SENSITIVITY)    EKG EKG Interpretation  Date/Time:  Wednesday  January 30 2022 15:54:12 EDT Ventricular Rate:  73 PR Interval:  216 QRS Duration: 136 QT Interval:  462 QTC Calculation: 510 R Axis:   -76 Text Interpretation: Sinus rhythm Borderline prolonged PR interval No significant change was found Confirmed by Ezequiel Essex [phone]) on 01/30/2022 4:04:56 PM  Radiology CT Angio Chest/Abd/Pel for Dissection W and/or Wo Contrast  Result Date: 01/30/2022 CLINICAL DATA:  Acute aortic syndrome suspected. EXAM: CT ANGIOGRAPHY CHEST, ABDOMEN AND PELVIS TECHNIQUE: Non-contrast CT of the chest was initially obtained. Multidetector CT imaging through the chest, abdomen and pelvis was performed using the standard protocol during bolus administration of intravenous contrast. Multiplanar reconstructed images and MIPs were obtained and reviewed to evaluate the vascular anatomy. RADIATION DOSE REDUCTION: This  exam was performed according to the departmental dose-optimization program which includes automated exposure control, adjustment of the mA and/or kV according to patient size and/or use of iterative reconstruction technique. CONTRAST:  88m OMNIPAQUE IOHEXOL 300 MG/ML  SOLN COMPARISON:  Chest CT dated 09/20/2020. FINDINGS: CTA CHEST FINDINGS Cardiovascular: There is no cardiomegaly or pericardial effusion. There is mild atherosclerotic calcification of the thoracic aorta. No aneurysmal dilatation or dissection. The origins of the great vessels of the aortic arch and the central pulmonary arteries appear patent. Left-sided pacemaker device. Mediastinum/Nodes: No hilar or mediastinal adenopathy. The esophagus is grossly unremarkable. No mediastinal fluid collection. Lungs/Pleura: The lungs are clear. There is no pleural effusion pneumothorax. The central airways are patent. Musculoskeletal: Degenerative changes of the spine. No acute osseous pathology. Review of the MIP images confirms the above findings. CTA ABDOMEN AND PELVIS FINDINGS VASCULAR Aorta: Mild atherosclerotic  calcification. No aneurysmal dilatation or dissection. No periaortic fluid collection. Celiac: Patent without evidence of aneurysm, dissection, vasculitis or significant stenosis. SMA: Patent without evidence of aneurysm, dissection, vasculitis or significant stenosis. Renals: Both renal arteries are patent without evidence of aneurysm, dissection, vasculitis, fibromuscular dysplasia or significant stenosis. IMA: Patent without evidence of aneurysm, dissection, vasculitis or significant stenosis. Inflow: Mild atherosclerotic calcification. No aneurysmal dilatation or dissection. The iliac arteries are patent. Veins: No obvious venous abnormality within the limitations of this arterial phase study. Review of the MIP images confirms the above findings. NON-VASCULAR No intra-abdominal free air or free fluid. Hepatobiliary: No focal liver abnormality is seen. No gallstones, gallbladder wall thickening, or biliary dilatation. Pancreas: Unremarkable. No pancreatic ductal dilatation or surrounding inflammatory changes. Spleen: Normal in size without focal abnormality. Adrenals/Urinary Tract: The adrenal glands unremarkable. Subcentimeter right renal inferior pole hypodense focus is not characterized. Further evaluation with ultrasound on a nonemergent/outpatient basis recommended. The kidneys, visualized ureters, and urinary bladder otherwise appear unremarkable. Stomach/Bowel: There is no bowel obstruction or active inflammation. The appendix is normal. Lymphatic: No adenopathy. Reproductive: The uterus is anteverted. Probable 2 cm right fundal exophytic fibroid. No adnexal masses. Other: Small fat containing umbilical hernia. Musculoskeletal: Osteopenia with degenerative changes of the spine. No acute osseous pathology. Review of the MIP images confirms the above findings. IMPRESSION: 1. No acute intrathoracic, abdominal, or pelvic pathology. No aortic aneurysm or dissection. 2. No bowel obstruction. Normal appendix.  Electronically Signed   By: AAnner CreteM.D.   On: 01/30/2022 18:24    Procedures Procedures    Medications Ordered in ED Medications  lactated ringers bolus 1,000 mL (1,000 mLs Intravenous New Bag/Given 01/30/22 1527)  ondansetron (ZOFRAN) injection 4 mg (4 mg Intravenous Given 01/30/22 1527)    ED Course/ Medical Decision Making/ A&P                           Medical Decision Making Amount and/or Complexity of Data Reviewed Labs: ordered. Decision-making details documented in ED Course. Radiology: ordered and independent interpretation performed. Decision-making details documented in ED Course. ECG/medicine tests: ordered and independent interpretation performed. Decision-making details documented in ED Course.  Risk Prescription drug management.   Upper abdominal pain with nausea and vomiting after taking medications this morning.  Chest pain has since resolved.  Abdomen is soft without peritoneal signs.  IV fluids and antiemetics given.  Abdomen soft without peritoneal signs.  EKG shows no acute ischemia.  Labs show normal lactate.  No leukocytosis.  LFTs and lipase are normal.  Creatinine is normal.  Patient  feels improved on recheck and is tolerating p.o.  Troponin negative x2.  Low suspicion for ACS.  CT scan without acute surgical pathology.  No evidence of cholecystitis.  No aortic dissection or pulmonary embolism.  Abdomen soft and nontender on recheck.  Suspect her symptoms likely secondary to taking medications on an empty stomach. This test follow-up with her doctor for medication regimen adjustments.  Work-up today is reassuring without evidence of UTI or pneumonia.  No acute surgical pathology.  No evidence of ACS or pulmonary embolism.  Follow-up with PCP.  Return precautions discussed.       Final Clinical Impression(s) / ED Diagnoses Final diagnoses:  Nausea and vomiting, unspecified vomiting type  Abdominal pain, unspecified abdominal location     Rx / DC Orders ED Discharge Orders     None         Ezequiel Essex, MD 01/30/22 2127

## 2022-03-22 ENCOUNTER — Ambulatory Visit
Admission: RE | Admit: 2022-03-22 | Discharge: 2022-03-22 | Disposition: A | Discharge status: Home / Self Care | Payer: Medicare HMO | Source: Ambulatory Visit | Attending: Family Medicine | Admitting: Family Medicine

## 2022-03-22 DIAGNOSIS — E2839 Other primary ovarian failure: Secondary | ICD-10-CM

## 2022-04-02 ENCOUNTER — Ambulatory Visit (INDEPENDENT_AMBULATORY_CARE_PROVIDER_SITE_OTHER): Payer: Medicare HMO

## 2022-04-02 DIAGNOSIS — I442 Atrioventricular block, complete: Secondary | ICD-10-CM

## 2022-04-03 LAB — CUP PACEART REMOTE DEVICE CHECK
Battery Remaining Longevity: 131 mo
Battery Voltage: 3.02 V
Brady Statistic AP VP Percent: 2.26 %
Brady Statistic AP VS Percent: 0 %
Brady Statistic AS VP Percent: 97.47 %
Brady Statistic AS VS Percent: 0.26 %
Brady Statistic RA Percent Paced: 2.35 %
Brady Statistic RV Percent Paced: 99.74 %
Date Time Interrogation Session: 20240102235646
Implantable Lead Connection Status: 753985
Implantable Lead Connection Status: 753985
Implantable Lead Implant Date: 20220623
Implantable Lead Implant Date: 20220623
Implantable Lead Location: 753859
Implantable Lead Location: 753860
Implantable Lead Model: 5076
Implantable Lead Model: 5076
Implantable Pulse Generator Implant Date: 20220623
Lead Channel Impedance Value: 342 Ohm
Lead Channel Impedance Value: 342 Ohm
Lead Channel Impedance Value: 437 Ohm
Lead Channel Impedance Value: 570 Ohm
Lead Channel Pacing Threshold Amplitude: 0.875 V
Lead Channel Pacing Threshold Amplitude: 1.125 V
Lead Channel Pacing Threshold Pulse Width: 0.4 ms
Lead Channel Pacing Threshold Pulse Width: 0.4 ms
Lead Channel Sensing Intrinsic Amplitude: 17 mV
Lead Channel Sensing Intrinsic Amplitude: 17 mV
Lead Channel Sensing Intrinsic Amplitude: 3.375 mV
Lead Channel Sensing Intrinsic Amplitude: 3.375 mV
Lead Channel Setting Pacing Amplitude: 2 V
Lead Channel Setting Pacing Amplitude: 2.25 V
Lead Channel Setting Pacing Pulse Width: 0.4 ms
Lead Channel Setting Sensing Sensitivity: 1.2 mV
Zone Setting Status: 755011

## 2022-05-01 ENCOUNTER — Ambulatory Visit: Payer: Medicare HMO | Admitting: Podiatry

## 2022-05-09 ENCOUNTER — Ambulatory Visit: Payer: Medicare HMO | Admitting: Podiatry

## 2022-05-09 NOTE — Progress Notes (Signed)
Remote pacemaker transmission.   

## 2022-06-03 ENCOUNTER — Ambulatory Visit: Payer: Medicare HMO | Admitting: Podiatry

## 2022-07-01 ENCOUNTER — Ambulatory Visit: Payer: Medicare HMO | Attending: Cardiology

## 2022-07-01 DIAGNOSIS — I442 Atrioventricular block, complete: Secondary | ICD-10-CM | POA: Diagnosis not present

## 2022-07-02 LAB — CUP PACEART REMOTE DEVICE CHECK
Battery Remaining Longevity: 128 mo
Battery Voltage: 3.02 V
Brady Statistic AP VP Percent: 2.25 %
Brady Statistic AP VS Percent: 0 %
Brady Statistic AS VP Percent: 97.61 %
Brady Statistic AS VS Percent: 0.14 %
Brady Statistic RA Percent Paced: 2.32 %
Brady Statistic RV Percent Paced: 99.86 %
Date Time Interrogation Session: 20240401000631
Implantable Lead Connection Status: 753985
Implantable Lead Connection Status: 753985
Implantable Lead Implant Date: 20220623
Implantable Lead Implant Date: 20220623
Implantable Lead Location: 753859
Implantable Lead Location: 753860
Implantable Lead Model: 5076
Implantable Lead Model: 5076
Implantable Pulse Generator Implant Date: 20220623
Lead Channel Impedance Value: 342 Ohm
Lead Channel Impedance Value: 342 Ohm
Lead Channel Impedance Value: 437 Ohm
Lead Channel Impedance Value: 532 Ohm
Lead Channel Pacing Threshold Amplitude: 0.875 V
Lead Channel Pacing Threshold Amplitude: 1 V
Lead Channel Pacing Threshold Pulse Width: 0.4 ms
Lead Channel Pacing Threshold Pulse Width: 0.4 ms
Lead Channel Sensing Intrinsic Amplitude: 22.75 mV
Lead Channel Sensing Intrinsic Amplitude: 22.75 mV
Lead Channel Sensing Intrinsic Amplitude: 3 mV
Lead Channel Sensing Intrinsic Amplitude: 3 mV
Lead Channel Setting Pacing Amplitude: 2 V
Lead Channel Setting Pacing Amplitude: 2 V
Lead Channel Setting Pacing Pulse Width: 0.4 ms
Lead Channel Setting Sensing Sensitivity: 1.2 mV
Zone Setting Status: 755011

## 2022-07-03 ENCOUNTER — Ambulatory Visit (INDEPENDENT_AMBULATORY_CARE_PROVIDER_SITE_OTHER): Payer: Medicare HMO | Admitting: Podiatry

## 2022-07-03 ENCOUNTER — Encounter: Payer: Self-pay | Admitting: Podiatry

## 2022-07-03 DIAGNOSIS — M79675 Pain in left toe(s): Secondary | ICD-10-CM

## 2022-07-03 DIAGNOSIS — E1142 Type 2 diabetes mellitus with diabetic polyneuropathy: Secondary | ICD-10-CM

## 2022-07-03 DIAGNOSIS — B351 Tinea unguium: Secondary | ICD-10-CM | POA: Diagnosis not present

## 2022-07-03 DIAGNOSIS — M79674 Pain in right toe(s): Secondary | ICD-10-CM | POA: Diagnosis not present

## 2022-07-03 NOTE — Progress Notes (Signed)
This patient returns to my office for at risk foot care.  This patient requires this care by a professional since this patient will be at risk due to having diabetes.  This patient is unable to cut nails herself since the patient cannot reach her nails.These nails are painful walking and wearing shoes.  This patient presents for at risk foot care today.  General Appearance  Alert, conversant and in no acute stress.  Vascular  Dorsalis pedis and posterior tibial  pulses are weakly palpable  bilaterally.  Capillary return is within normal limits  Bilaterally.Cold feet  B/L.  bilaterally.  Neurologic  Senn-Weinstein monofilament wire test diminished   bilaterally. Muscle power within normal limits bilaterally.  Nails Thick disfigured discolored nails with subungual debris  from hallux to fifth toes bilaterally.  Pincer nails. No evidence of bacterial infection or drainage bilaterally.  Orthopedic  No limitations of motion  feet .  No crepitus or effusions noted.  No bony pathology or digital deformities noted.  Skin  normotropic skin with no porokeratosis noted bilaterally.  No signs of infections or ulcers noted.     Onychomycosis  Pain in right toes  Pain in left toes  Consent was obtained for treatment procedures.   Mechanical debridement of nails 1-5  bilaterally performed with a nail nipper.  Filed with dremel without incident.     Return office visit    3 months                  Told patient to return for periodic foot care and evaluation due to potential at risk complications.   Bianca Vester DPM  

## 2022-08-12 NOTE — Progress Notes (Signed)
Remote pacemaker transmission.   

## 2022-09-30 ENCOUNTER — Ambulatory Visit (INDEPENDENT_AMBULATORY_CARE_PROVIDER_SITE_OTHER): Payer: Medicare HMO

## 2022-09-30 DIAGNOSIS — I442 Atrioventricular block, complete: Secondary | ICD-10-CM

## 2022-10-01 LAB — CUP PACEART REMOTE DEVICE CHECK
Battery Remaining Longevity: 125 mo
Battery Voltage: 3.01 V
Brady Statistic AP VP Percent: 1.83 %
Brady Statistic AP VS Percent: 0 %
Brady Statistic AS VP Percent: 97.94 %
Brady Statistic AS VS Percent: 0.23 %
Brady Statistic RA Percent Paced: 1.97 %
Brady Statistic RV Percent Paced: 99.77 %
Date Time Interrogation Session: 20240630230536
Implantable Lead Connection Status: 753985
Implantable Lead Connection Status: 753985
Implantable Lead Implant Date: 20220623
Implantable Lead Implant Date: 20220623
Implantable Lead Location: 753859
Implantable Lead Location: 753860
Implantable Lead Model: 5076
Implantable Lead Model: 5076
Implantable Pulse Generator Implant Date: 20220623
Lead Channel Impedance Value: 342 Ohm
Lead Channel Impedance Value: 342 Ohm
Lead Channel Impedance Value: 437 Ohm
Lead Channel Impedance Value: 551 Ohm
Lead Channel Pacing Threshold Amplitude: 1 V
Lead Channel Pacing Threshold Amplitude: 1.125 V
Lead Channel Pacing Threshold Pulse Width: 0.4 ms
Lead Channel Pacing Threshold Pulse Width: 0.4 ms
Lead Channel Sensing Intrinsic Amplitude: 3.125 mV
Lead Channel Sensing Intrinsic Amplitude: 3.125 mV
Lead Channel Sensing Intrinsic Amplitude: 31.625 mV
Lead Channel Sensing Intrinsic Amplitude: 31.625 mV
Lead Channel Setting Pacing Amplitude: 2 V
Lead Channel Setting Pacing Amplitude: 2.25 V
Lead Channel Setting Pacing Pulse Width: 0.4 ms
Lead Channel Setting Sensing Sensitivity: 1.2 mV
Zone Setting Status: 755011

## 2022-10-02 ENCOUNTER — Encounter: Payer: Self-pay | Admitting: Podiatry

## 2022-10-02 ENCOUNTER — Ambulatory Visit (INDEPENDENT_AMBULATORY_CARE_PROVIDER_SITE_OTHER): Payer: Medicare HMO | Admitting: Podiatry

## 2022-10-02 DIAGNOSIS — M79674 Pain in right toe(s): Secondary | ICD-10-CM | POA: Diagnosis not present

## 2022-10-02 DIAGNOSIS — M79675 Pain in left toe(s): Secondary | ICD-10-CM | POA: Diagnosis not present

## 2022-10-02 DIAGNOSIS — B351 Tinea unguium: Secondary | ICD-10-CM | POA: Diagnosis not present

## 2022-10-02 DIAGNOSIS — E1142 Type 2 diabetes mellitus with diabetic polyneuropathy: Secondary | ICD-10-CM

## 2022-10-02 NOTE — Progress Notes (Signed)
This patient returns to my office for at risk foot care.  This patient requires this care by a professional since this patient will be at risk due to having diabetes.  This patient is unable to cut nails herself since the patient cannot reach her nails.These nails are painful walking and wearing shoes.  This patient presents for at risk foot care today.  General Appearance  Alert, conversant and in no acute stress.  Vascular  Dorsalis pedis and posterior tibial  pulses are weakly palpable  bilaterally.  Capillary return is within normal limits  Bilaterally.Cold feet  B/L.  bilaterally.  Neurologic  Senn-Weinstein monofilament wire test diminished   bilaterally. Muscle power within normal limits bilaterally.  Nails Thick disfigured discolored nails with subungual debris  from hallux to fifth toes bilaterally.  Pincer nails. No evidence of bacterial infection or drainage bilaterally.  Orthopedic  No limitations of motion  feet .  No crepitus or effusions noted.  No bony pathology or digital deformities noted.  Skin  normotropic skin with no porokeratosis noted bilaterally.  No signs of infections or ulcers noted.     Onychomycosis  Pain in right toes  Pain in left toes  Consent was obtained for treatment procedures.   Mechanical debridement of nails 1-5  bilaterally performed with a nail nipper.  Filed with dremel without incident.     Return office visit    3 months                  Told patient to return for periodic foot care and evaluation due to potential at risk complications.   Einar Nolasco DPM  

## 2022-10-22 NOTE — Progress Notes (Signed)
Remote pacemaker transmission.   

## 2022-12-30 ENCOUNTER — Ambulatory Visit: Payer: Medicare HMO

## 2022-12-30 DIAGNOSIS — I442 Atrioventricular block, complete: Secondary | ICD-10-CM | POA: Diagnosis not present

## 2022-12-30 LAB — CUP PACEART REMOTE DEVICE CHECK
Battery Remaining Longevity: 107 mo
Battery Voltage: 3 V
Brady Statistic AP VP Percent: 1.62 %
Brady Statistic AP VS Percent: 0 %
Brady Statistic AS VP Percent: 98.26 %
Brady Statistic AS VS Percent: 0.12 %
Brady Statistic RA Percent Paced: 1.66 %
Brady Statistic RV Percent Paced: 99.87 %
Date Time Interrogation Session: 20240929221702
Implantable Lead Connection Status: 753985
Implantable Lead Connection Status: 753985
Implantable Lead Implant Date: 20220623
Implantable Lead Implant Date: 20220623
Implantable Lead Location: 753859
Implantable Lead Location: 753860
Implantable Lead Model: 5076
Implantable Lead Model: 5076
Implantable Pulse Generator Implant Date: 20220623
Lead Channel Impedance Value: 342 Ohm
Lead Channel Impedance Value: 342 Ohm
Lead Channel Impedance Value: 437 Ohm
Lead Channel Impedance Value: 532 Ohm
Lead Channel Pacing Threshold Amplitude: 1 V
Lead Channel Pacing Threshold Amplitude: 1.25 V
Lead Channel Pacing Threshold Pulse Width: 0.4 ms
Lead Channel Pacing Threshold Pulse Width: 0.4 ms
Lead Channel Sensing Intrinsic Amplitude: 26.75 mV
Lead Channel Sensing Intrinsic Amplitude: 26.75 mV
Lead Channel Sensing Intrinsic Amplitude: 3.25 mV
Lead Channel Sensing Intrinsic Amplitude: 3.25 mV
Lead Channel Setting Pacing Amplitude: 2 V
Lead Channel Setting Pacing Amplitude: 2.5 V
Lead Channel Setting Pacing Pulse Width: 0.4 ms
Lead Channel Setting Sensing Sensitivity: 1.2 mV
Zone Setting Status: 755011

## 2023-01-03 ENCOUNTER — Encounter: Payer: Self-pay | Admitting: Podiatry

## 2023-01-03 ENCOUNTER — Ambulatory Visit (INDEPENDENT_AMBULATORY_CARE_PROVIDER_SITE_OTHER): Payer: Medicare HMO | Admitting: Podiatry

## 2023-01-03 DIAGNOSIS — M79674 Pain in right toe(s): Secondary | ICD-10-CM

## 2023-01-03 DIAGNOSIS — E1142 Type 2 diabetes mellitus with diabetic polyneuropathy: Secondary | ICD-10-CM

## 2023-01-03 DIAGNOSIS — M79675 Pain in left toe(s): Secondary | ICD-10-CM | POA: Diagnosis not present

## 2023-01-03 DIAGNOSIS — B351 Tinea unguium: Secondary | ICD-10-CM | POA: Diagnosis not present

## 2023-01-03 NOTE — Progress Notes (Signed)
This patient returns to my office for at risk foot care.  This patient requires this care by a professional since this patient will be at risk due to having diabetes.  This patient is unable to cut nails herself since the patient cannot reach her nails.These nails are painful walking and wearing shoes.  This patient presents for at risk foot care today.  General Appearance  Alert, conversant and in no acute stress.  Vascular  Dorsalis pedis and posterior tibial  pulses are weakly palpable  bilaterally.  Capillary return is within normal limits  Bilaterally.Cold feet  B/L.  bilaterally.  Neurologic  Senn-Weinstein monofilament wire test diminished   bilaterally. Muscle power within normal limits bilaterally.  Nails Thick disfigured discolored nails with subungual debris  from hallux to fifth toes bilaterally.  Pincer nails. No evidence of bacterial infection or drainage bilaterally.  Orthopedic  No limitations of motion  feet .  No crepitus or effusions noted.  No bony pathology or digital deformities noted.  Skin  normotropic skin with no porokeratosis noted bilaterally.  No signs of infections or ulcers noted.     Onychomycosis  Pain in right toes  Pain in left toes  Consent was obtained for treatment procedures.   Mechanical debridement of nails 1-5  bilaterally performed with a nail nipper.  Filed with dremel without incident.     Return office visit    3 months                  Told patient to return for periodic foot care and evaluation due to potential at risk complications.   Abdoulaye Drum DPM  

## 2023-01-06 ENCOUNTER — Other Ambulatory Visit: Payer: Self-pay | Admitting: Physician Assistant

## 2023-01-06 DIAGNOSIS — Z1231 Encounter for screening mammogram for malignant neoplasm of breast: Secondary | ICD-10-CM

## 2023-01-14 NOTE — Progress Notes (Signed)
Remote pacemaker transmission.   

## 2023-02-05 ENCOUNTER — Ambulatory Visit: Payer: Medicare HMO

## 2023-02-05 NOTE — Progress Notes (Deleted)
  Electrophysiology Office Note:   ID:  Jacqueline Simon, DOB [birth year], MRN [id number]  Primary Cardiologist: None Electrophysiologist: Will Jorja Loa, MD  {Click to update primary MD,subspecialty MD or APP then REFRESH:1}    History of Present Illness:   Jacqueline Simon is a 72 y.o. female with h/o CHB s/p PPM, HTN, HLD, DM2, and obesity seen today for routine electrophysiology followup.   Since last being seen in our clinic the patient reports doing ***.  she denies chest pain, palpitations, dyspnea, PND, orthopnea, nausea, vomiting, dizziness, syncope, edema, weight gain, or early satiety.   Review of systems complete and found to be negative unless listed in HPI.   EP Information / Studies Reviewed:    EKG is ordered today. Personal review as below.       PPM Interrogation-  reviewed in detail today,  See PACEART report.  Device History: Medtronic Dual Chamber PPM implanted 09/21/2020 for CHB  Physical Exam:   VS:  There were no vitals taken for this visit.   Wt Readings from Last 3 Encounters:  01/21/22 222 lb 3.2 oz (100.8 kg)  01/04/21 221 lb 3.2 oz (100.3 kg)  09/22/20 226 lb 3.1 oz (102.6 kg)     GEN: Well nourished, well developed in no acute distress NECK: No JVD; No carotid bruits CARDIAC: {EPRHYTHM:28826}, no murmurs, rubs, gallops RESPIRATORY:  Clear to auscultation without rales, wheezing or rhonchi  ABDOMEN: Soft, non-tender, non-distended EXTREMITIES:  No edema; No deformity   ASSESSMENT AND PLAN:    CHB s/p Medtronic PPM  Normal PPM function See Pace Art report No changes today  HTN Stable on current regimen   HLD Continue statin   {Click here to Review PMH, Prob List, Meds, Allergies, SHx, FHx  :1}   Disposition:   Follow up with Dr. Elberta Fortis in 12 months  Signed, Graciella Freer, PA-C

## 2023-02-06 ENCOUNTER — Ambulatory Visit: Payer: Medicare HMO | Admitting: Student

## 2023-02-06 DIAGNOSIS — E7800 Pure hypercholesterolemia, unspecified: Secondary | ICD-10-CM

## 2023-02-06 DIAGNOSIS — I442 Atrioventricular block, complete: Secondary | ICD-10-CM

## 2023-02-06 DIAGNOSIS — I10 Essential (primary) hypertension: Secondary | ICD-10-CM

## 2023-02-25 NOTE — Progress Notes (Unsigned)
  Electrophysiology Office Note:   ID:  Jacqueline Simon, DOB 1950/04/02, MRN [id number]  Primary Cardiologist: None Electrophysiologist: Will Jorja Loa, MD  {Click to update primary MD,subspecialty MD or APP then REFRESH:1}    History of Present Illness:   Jacqueline Simon is a 72 y.o. female with h/o CHB s/p PPM, HTN, HLD, DM2, and obesity seen today for routine electrophysiology followup.   Since last being seen in our clinic the patient reports doing ***.  she denies chest pain, palpitations, dyspnea, PND, orthopnea, nausea, vomiting, dizziness, syncope, edema, weight gain, or early satiety.   Review of systems complete and found to be negative unless listed in HPI.   EP Information / Studies Reviewed:    EKG is ordered today. Personal review as below.       PPM Interrogation-  reviewed in detail today,  See PACEART report.  Device History: Medtronic Dual Chamber PPM implanted 09/21/2020 for CHB  Physical Exam:   VS:  There were no vitals taken for this visit.   Wt Readings from Last 3 Encounters:  01/21/22 222 lb 3.2 oz (100.8 kg)  01/04/21 221 lb 3.2 oz (100.3 kg)  09/22/20 226 lb 3.1 oz (102.6 kg)     GEN: Well nourished, well developed in no acute distress NECK: No JVD; No carotid bruits CARDIAC: {EPRHYTHM:28826}, no murmurs, rubs, gallops RESPIRATORY:  Clear to auscultation without rales, wheezing or rhonchi  ABDOMEN: Soft, non-tender, non-distended EXTREMITIES:  No edema; No deformity   ASSESSMENT AND PLAN:    CHB s/p Medtronic PPM  Normal PPM function See Pace Art report No changes today    Insert SmartText    HTN Stable on current regimen   HLD Continue statin   {Click here to Review PMH, Prob List, Meds, Allergies, SHx, FHx  :1}   Disposition:   Follow up with Dr. Elberta Fortis in 12 months  Signed, Graciella Freer, PA-C

## 2023-02-26 ENCOUNTER — Ambulatory Visit: Payer: Medicare HMO | Attending: Student | Admitting: Student

## 2023-02-26 ENCOUNTER — Encounter: Payer: Self-pay | Admitting: Student

## 2023-02-26 VITALS — BP 168/72 | HR 74 | Ht 62.0 in | Wt 208.0 lb

## 2023-02-26 DIAGNOSIS — I442 Atrioventricular block, complete: Secondary | ICD-10-CM

## 2023-02-26 DIAGNOSIS — I10 Essential (primary) hypertension: Secondary | ICD-10-CM | POA: Diagnosis not present

## 2023-02-26 DIAGNOSIS — E7800 Pure hypercholesterolemia, unspecified: Secondary | ICD-10-CM | POA: Diagnosis not present

## 2023-02-26 LAB — CUP PACEART INCLINIC DEVICE CHECK
Battery Remaining Longevity: 105 mo
Battery Voltage: 3 V
Brady Statistic AP VP Percent: 2.13 %
Brady Statistic AP VS Percent: 0 %
Brady Statistic AS VP Percent: 97.71 %
Brady Statistic AS VS Percent: 0.16 %
Brady Statistic RA Percent Paced: 2.2 %
Brady Statistic RV Percent Paced: 99.84 %
Date Time Interrogation Session: 20241127130310
Implantable Lead Connection Status: 753985
Implantable Lead Connection Status: 753985
Implantable Lead Implant Date: 20220623
Implantable Lead Implant Date: 20220623
Implantable Lead Location: 753859
Implantable Lead Location: 753860
Implantable Lead Model: 5076
Implantable Lead Model: 5076
Implantable Pulse Generator Implant Date: 20220623
Lead Channel Impedance Value: 342 Ohm
Lead Channel Impedance Value: 342 Ohm
Lead Channel Impedance Value: 437 Ohm
Lead Channel Impedance Value: 551 Ohm
Lead Channel Pacing Threshold Amplitude: 1 V
Lead Channel Pacing Threshold Amplitude: 1.25 V
Lead Channel Pacing Threshold Pulse Width: 0.4 ms
Lead Channel Pacing Threshold Pulse Width: 0.4 ms
Lead Channel Sensing Intrinsic Amplitude: 2.75 mV
Lead Channel Sensing Intrinsic Amplitude: 26.75 mV
Lead Channel Sensing Intrinsic Amplitude: 26.75 mV
Lead Channel Sensing Intrinsic Amplitude: 3.5 mV
Lead Channel Setting Pacing Amplitude: 2 V
Lead Channel Setting Pacing Amplitude: 2.5 V
Lead Channel Setting Pacing Pulse Width: 0.4 ms
Lead Channel Setting Sensing Sensitivity: 1.2 mV
Zone Setting Status: 755011

## 2023-02-26 NOTE — Patient Instructions (Signed)
Medication Instructions:  Your physician recommends that you continue on your current medications as directed. Please refer to the Current Medication list given to you today.  *If you need a refill on your cardiac medications before your next appointment, please call your pharmacy*  Lab Work: None ordered If you have labs (blood work) drawn today and your tests are completely normal, you will receive your results only by: MyChart Message (if you have MyChart) OR A paper copy in the mail If you have any lab test that is abnormal or we need to change your treatment, we will call you to review the results.  Follow-Up: At [facility], you and your health needs are our priority.  As part of our continuing mission to provide you with exceptional heart care, we have created designated Provider Care Teams.  These Care Teams include your primary Cardiologist (physician) and Advanced Practice Providers (APPs -  Physician Assistants and Nurse Practitioners) who all work together to provide you with the care you need, when you need it.  We recommend signing up for the patient portal called "MyChart".  Sign up information is provided on this After Visit Summary.  MyChart is used to connect with patients for Virtual Visits (Telemedicine).  Patients are able to view lab/test results, encounter notes, upcoming appointments, etc.  Non-urgent messages can be sent to your provider as well.   To learn more about what you can do with MyChart, go to [email or web address].    Your next appointment:   1 year(s)  Provider:   Loman Brooklyn, MD or [facility]" [place], [place]

## 2023-03-13 ENCOUNTER — Ambulatory Visit
Admission: RE | Admit: 2023-03-13 | Discharge: 2023-03-13 | Disposition: A | Discharge status: Home / Self Care | Payer: Medicare HMO | Source: Ambulatory Visit | Attending: Physician Assistant | Admitting: Physician Assistant

## 2023-03-13 DIAGNOSIS — Z1231 Encounter for screening mammogram for malignant neoplasm of breast: Secondary | ICD-10-CM

## 2023-03-31 ENCOUNTER — Ambulatory Visit (INDEPENDENT_AMBULATORY_CARE_PROVIDER_SITE_OTHER): Payer: Medicare HMO

## 2023-03-31 DIAGNOSIS — I442 Atrioventricular block, complete: Secondary | ICD-10-CM | POA: Diagnosis not present

## 2023-03-31 LAB — CUP PACEART REMOTE DEVICE CHECK
Battery Remaining Longevity: 107 mo
Battery Voltage: 3 V
Brady Statistic AP VP Percent: 5.69 %
Brady Statistic AP VS Percent: 0 %
Brady Statistic AS VP Percent: 94.25 %
Brady Statistic AS VS Percent: 0.06 %
Brady Statistic RA Percent Paced: 5.63 %
Brady Statistic RV Percent Paced: 99.94 %
Date Time Interrogation Session: 20241229221104
Implantable Lead Connection Status: 753985
Implantable Lead Connection Status: 753985
Implantable Lead Implant Date: 20220623
Implantable Lead Implant Date: 20220623
Implantable Lead Location: 753859
Implantable Lead Location: 753860
Implantable Lead Model: 5076
Implantable Lead Model: 5076
Implantable Pulse Generator Implant Date: 20220623
Lead Channel Impedance Value: 342 Ohm
Lead Channel Impedance Value: 342 Ohm
Lead Channel Impedance Value: 418 Ohm
Lead Channel Impedance Value: 532 Ohm
Lead Channel Pacing Threshold Amplitude: 0.75 V
Lead Channel Pacing Threshold Amplitude: 1 V
Lead Channel Pacing Threshold Pulse Width: 0.4 ms
Lead Channel Pacing Threshold Pulse Width: 0.4 ms
Lead Channel Sensing Intrinsic Amplitude: 2.75 mV
Lead Channel Sensing Intrinsic Amplitude: 2.75 mV
Lead Channel Sensing Intrinsic Amplitude: 26.75 mV
Lead Channel Sensing Intrinsic Amplitude: 26.75 mV
Lead Channel Setting Pacing Amplitude: 1.5 V
Lead Channel Setting Pacing Amplitude: 2.25 V
Lead Channel Setting Pacing Pulse Width: 0.4 ms
Lead Channel Setting Sensing Sensitivity: 1.2 mV
Zone Setting Status: 755011

## 2023-04-10 ENCOUNTER — Ambulatory Visit: Payer: Medicare Other | Admitting: Podiatry

## 2023-05-12 ENCOUNTER — Encounter: Payer: Self-pay | Admitting: Podiatry

## 2023-05-12 ENCOUNTER — Ambulatory Visit: Payer: Medicare PPO | Admitting: Podiatry

## 2023-05-12 DIAGNOSIS — M79674 Pain in right toe(s): Secondary | ICD-10-CM

## 2023-05-12 DIAGNOSIS — E1142 Type 2 diabetes mellitus with diabetic polyneuropathy: Secondary | ICD-10-CM

## 2023-05-12 DIAGNOSIS — B351 Tinea unguium: Secondary | ICD-10-CM

## 2023-05-12 DIAGNOSIS — M79675 Pain in left toe(s): Secondary | ICD-10-CM | POA: Diagnosis not present

## 2023-05-12 NOTE — Progress Notes (Signed)
 This patient returns to my office for at risk foot care.  This patient requires this care by a professional since this patient will be at risk due to having diabetes.  This patient is unable to cut nails herself since the patient cannot reach her nails.These nails are painful walking and wearing shoes.  This patient presents for at risk foot care today.  General Appearance  Alert, conversant and in no acute stress.  Vascular  Dorsalis pedis and posterior tibial  pulses are weakly palpable  bilaterally.  Capillary return is within normal limits  Bilaterally.Cold feet  B/L.  bilaterally.  Neurologic  Senn-Weinstein monofilament wire test diminished   bilaterally. Muscle power within normal limits bilaterally.  Nails Thick disfigured discolored nails with subungual debris  from hallux to fifth toes bilaterally.  Pincer nails. No evidence of bacterial infection or drainage bilaterally.  Orthopedic  No limitations of motion  feet .  No crepitus or effusions noted.  No bony pathology or digital deformities noted.  Skin  normotropic skin with no porokeratosis noted bilaterally.  No signs of infections or ulcers noted.     Onychomycosis  Pain in right toes  Pain in left toes  Consent was obtained for treatment procedures.   Mechanical debridement of nails 1-5  bilaterally performed with a nail nipper.  Filed with dremel without incident.     Return office visit   4    months                  Told patient to return for periodic foot care and evaluation due to potential at risk complications.   Ruffin Cotton DPM

## 2023-06-30 ENCOUNTER — Ambulatory Visit (INDEPENDENT_AMBULATORY_CARE_PROVIDER_SITE_OTHER): Payer: Medicare HMO

## 2023-06-30 DIAGNOSIS — I442 Atrioventricular block, complete: Secondary | ICD-10-CM | POA: Diagnosis not present

## 2023-07-01 LAB — CUP PACEART REMOTE DEVICE CHECK
Battery Remaining Longevity: 107 mo
Battery Voltage: 3 V
Brady Statistic AP VP Percent: 1.37 %
Brady Statistic AP VS Percent: 0 %
Brady Statistic AS VP Percent: 98.56 %
Brady Statistic AS VS Percent: 0.07 %
Brady Statistic RA Percent Paced: 1.38 %
Brady Statistic RV Percent Paced: 99.93 %
Date Time Interrogation Session: 20250330211718
Implantable Lead Connection Status: 753985
Implantable Lead Connection Status: 753985
Implantable Lead Implant Date: 20220623
Implantable Lead Implant Date: 20220623
Implantable Lead Location: 753859
Implantable Lead Location: 753860
Implantable Lead Model: 5076
Implantable Lead Model: 5076
Implantable Pulse Generator Implant Date: 20220623
Lead Channel Impedance Value: 342 Ohm
Lead Channel Impedance Value: 342 Ohm
Lead Channel Impedance Value: 456 Ohm
Lead Channel Impedance Value: 532 Ohm
Lead Channel Pacing Threshold Amplitude: 0.75 V
Lead Channel Pacing Threshold Amplitude: 1.125 V
Lead Channel Pacing Threshold Pulse Width: 0.4 ms
Lead Channel Pacing Threshold Pulse Width: 0.4 ms
Lead Channel Sensing Intrinsic Amplitude: 17.875 mV
Lead Channel Sensing Intrinsic Amplitude: 17.875 mV
Lead Channel Sensing Intrinsic Amplitude: 3.375 mV
Lead Channel Sensing Intrinsic Amplitude: 3.375 mV
Lead Channel Setting Pacing Amplitude: 1.75 V
Lead Channel Setting Pacing Amplitude: 2.25 V
Lead Channel Setting Pacing Pulse Width: 0.4 ms
Lead Channel Setting Sensing Sensitivity: 1.2 mV
Zone Setting Status: 755011

## 2023-07-02 ENCOUNTER — Telehealth: Payer: Self-pay

## 2023-07-02 NOTE — Telephone Encounter (Addendum)
 Alert received from [employer]:  Scheduled remote reviewed. Normal device function.  Presenting rhythm: AS/VP. 3 VHR episodes, longest 11 sec (device defined), ~ 23 sec per dot plot, V>A conduction, average V rates 182-214 bpm  Outreach made to Pt-no answer.  Left message requesting call back.

## 2023-07-07 ENCOUNTER — Telehealth: Payer: Self-pay | Admitting: *Deleted

## 2023-07-07 NOTE — Telephone Encounter (Signed)
Attempted to call patient. No answer. LMTCB 

## 2023-07-07 NOTE — Telephone Encounter (Signed)
 Opened in error

## 2023-07-08 NOTE — Telephone Encounter (Signed)
 LMTRC, no answer.

## 2023-07-09 NOTE — Telephone Encounter (Signed)
Attempted to contact patient. No answer, left message to call back

## 2023-07-09 NOTE — Telephone Encounter (Signed)
 Certified letter sent.  Attempted all numbers on file with no success.

## 2023-07-14 NOTE — Telephone Encounter (Signed)
 Call back received from Pt.  She does not recall any symptoms related to this event.  Advised would forward to Dr. Lawana Pray for review and call back if any medication changes recommended.  Best number to call back [phone]

## 2023-07-15 MED ORDER — METOPROLOL SUCCINATE ER 50 MG PO TB24: 50.0000 mg | ORAL_TABLET | Freq: Every day | ORAL | 3 refills | Status: DC

## 2023-07-15 NOTE — Telephone Encounter (Addendum)
 Patient called back.  Instructions given to start Toprol XL 50mg  daily - recommended she take at bedtime. RX sent in to patient's pharmacy per Dr. Lawana Pray orders.   Also patient is to keep an eye on her blood pressures. Appears in office they tend to run normal to high.  She agrees with med and bp check plan and will let us  know if any concerns.  States she has had a cold recently but has only been taking coricidine HBP.  She will follow up with primary if doesn't get any better.

## 2023-07-15 NOTE — Addendum Note (Signed)
 Addended by: Candace Cerise on: 07/15/2023 08:23 AM   Modules accepted: Orders

## 2023-07-15 NOTE — Telephone Encounter (Addendum)
 Attempted to reach patient - no answer. Unable to LM on VM as it is full.

## 2023-08-15 NOTE — Progress Notes (Signed)
 Remote pacemaker transmission.

## 2023-08-15 NOTE — Addendum Note (Signed)
 Addended by: Edra Govern D on: 08/15/2023 02:36 PM   Modules accepted: Orders

## 2023-09-10 ENCOUNTER — Encounter: Payer: Self-pay | Admitting: Podiatry

## 2023-09-10 ENCOUNTER — Ambulatory Visit: Payer: Medicare PPO | Admitting: Podiatry

## 2023-09-10 DIAGNOSIS — E1142 Type 2 diabetes mellitus with diabetic polyneuropathy: Secondary | ICD-10-CM

## 2023-09-10 DIAGNOSIS — M79675 Pain in left toe(s): Secondary | ICD-10-CM

## 2023-09-10 DIAGNOSIS — B351 Tinea unguium: Secondary | ICD-10-CM

## 2023-09-10 DIAGNOSIS — M79674 Pain in right toe(s): Secondary | ICD-10-CM

## 2023-09-10 NOTE — Progress Notes (Signed)
This patient returns to my office for at risk foot care.  This patient requires this care by a professional since this patient will be at risk due to having diabetes.  This patient is unable to cut nails herself since the patient cannot reach her nails.These nails are painful walking and wearing shoes.  This patient presents for at risk foot care today.  General Appearance  Alert, conversant and in no acute stress.  Vascular  Dorsalis pedis and posterior tibial  pulses are weakly palpable  bilaterally.  Capillary return is within normal limits  Bilaterally.Cold feet  B/L.  bilaterally.  Neurologic  Senn-Weinstein monofilament wire test diminished   bilaterally. Muscle power within normal limits bilaterally.  Nails Thick disfigured discolored nails with subungual debris  from hallux to fifth toes bilaterally.  Pincer nails. No evidence of bacterial infection or drainage bilaterally.  Orthopedic  No limitations of motion  feet .  No crepitus or effusions noted.  No bony pathology or digital deformities noted.  Skin  normotropic skin with no porokeratosis noted bilaterally.  No signs of infections or ulcers noted.     Onychomycosis  Pain in right toes  Pain in left toes  Consent was obtained for treatment procedures.   Mechanical debridement of nails 1-5  bilaterally performed with a nail nipper.  Filed with dremel without incident.     Return office visit    3 months                  Told patient to return for periodic foot care and evaluation due to potential at risk complications.   Abdoulaye Drum DPM  

## 2023-09-29 ENCOUNTER — Ambulatory Visit (INDEPENDENT_AMBULATORY_CARE_PROVIDER_SITE_OTHER): Payer: Medicare HMO

## 2023-09-29 DIAGNOSIS — I442 Atrioventricular block, complete: Secondary | ICD-10-CM

## 2023-09-30 LAB — CUP PACEART REMOTE DEVICE CHECK
Battery Remaining Longevity: 101 mo
Battery Voltage: 2.99 V
Brady Statistic AP VP Percent: 29 %
Brady Statistic AP VS Percent: 0.01 %
Brady Statistic AS VP Percent: 70.82 %
Brady Statistic AS VS Percent: 0.17 %
Brady Statistic RA Percent Paced: 29.01 %
Brady Statistic RV Percent Paced: 99.83 %
Date Time Interrogation Session: 20250630094751
Implantable Lead Connection Status: 753985
Implantable Lead Connection Status: 753985
Implantable Lead Implant Date: 20220623
Implantable Lead Implant Date: 20220623
Implantable Lead Location: 753859
Implantable Lead Location: 753860
Implantable Lead Model: 5076
Implantable Lead Model: 5076
Implantable Pulse Generator Implant Date: 20220623
Lead Channel Impedance Value: 342 Ohm
Lead Channel Impedance Value: 342 Ohm
Lead Channel Impedance Value: 418 Ohm
Lead Channel Impedance Value: 475 Ohm
Lead Channel Pacing Threshold Amplitude: 0.75 V
Lead Channel Pacing Threshold Amplitude: 1.125 V
Lead Channel Pacing Threshold Pulse Width: 0.4 ms
Lead Channel Pacing Threshold Pulse Width: 0.4 ms
Lead Channel Sensing Intrinsic Amplitude: 19 mV
Lead Channel Sensing Intrinsic Amplitude: 19 mV
Lead Channel Sensing Intrinsic Amplitude: 3.125 mV
Lead Channel Sensing Intrinsic Amplitude: 3.125 mV
Lead Channel Setting Pacing Amplitude: 1.5 V
Lead Channel Setting Pacing Amplitude: 2.25 V
Lead Channel Setting Pacing Pulse Width: 0.4 ms
Lead Channel Setting Sensing Sensitivity: 1.2 mV
Zone Setting Status: 755011

## 2023-10-06 ENCOUNTER — Ambulatory Visit: Payer: Self-pay | Admitting: Cardiology

## 2023-11-18 ENCOUNTER — Other Ambulatory Visit: Payer: Self-pay | Admitting: Cardiology

## 2023-12-11 ENCOUNTER — Ambulatory Visit: Admitting: Podiatry

## 2023-12-29 ENCOUNTER — Ambulatory Visit: Payer: Medicare HMO

## 2023-12-31 NOTE — Progress Notes (Signed)
 Remote PPM Transmission

## 2024-02-12 ENCOUNTER — Telehealth: Payer: Self-pay

## 2024-02-12 NOTE — Telephone Encounter (Signed)
 Patient due this month for routine annual follow up.  Okay with an APP.  Please contact and scheduled. Add to appointment notes:  Missed 10/1 remote transmission, verify remote monitoring.  Thanks

## 2024-02-13 NOTE — Telephone Encounter (Signed)
 Patient was very hard to reach.   -Patients number - vm box full -[place] (dtr) - call not completed  -Curtistine (son) - not in service -Cathy (dtr) - lvmtcb   Was finally able to reach her son Chelbi Herber via the [facility]. Patient is scheduled to see Jodie Passey, PA on 12/9.

## 2024-02-21 ENCOUNTER — Other Ambulatory Visit: Payer: Self-pay | Admitting: Cardiology

## 2024-03-09 ENCOUNTER — Ambulatory Visit: Admitting: Student

## 2024-03-29 ENCOUNTER — Ambulatory Visit (INDEPENDENT_AMBULATORY_CARE_PROVIDER_SITE_OTHER): Payer: Medicare HMO

## 2024-03-29 DIAGNOSIS — I442 Atrioventricular block, complete: Secondary | ICD-10-CM | POA: Diagnosis not present

## 2024-03-30 LAB — CUP PACEART REMOTE DEVICE CHECK
Battery Remaining Longevity: 84 mo
Battery Voltage: 2.99 V
Brady Statistic AP VP Percent: 34.94 %
Brady Statistic AP VS Percent: 0 %
Brady Statistic AS VP Percent: 64.96 %
Brady Statistic AS VS Percent: 0.1 %
Brady Statistic RA Percent Paced: 34.92 %
Brady Statistic RV Percent Paced: 99.9 %
Date Time Interrogation Session: 20251229023200
Implantable Lead Connection Status: 753985
Implantable Lead Connection Status: 753985
Implantable Lead Implant Date: 20220623
Implantable Lead Implant Date: 20220623
Implantable Lead Location: 753859
Implantable Lead Location: 753860
Implantable Lead Model: 5076
Implantable Lead Model: 5076
Implantable Pulse Generator Implant Date: 20220623
Lead Channel Impedance Value: 342 Ohm
Lead Channel Impedance Value: 380 Ohm
Lead Channel Impedance Value: 437 Ohm
Lead Channel Impedance Value: 551 Ohm
Lead Channel Pacing Threshold Amplitude: 0.75 V
Lead Channel Pacing Threshold Amplitude: 1.125 V
Lead Channel Pacing Threshold Pulse Width: 0.4 ms
Lead Channel Pacing Threshold Pulse Width: 0.4 ms
Lead Channel Sensing Intrinsic Amplitude: 19.875 mV
Lead Channel Sensing Intrinsic Amplitude: 19.875 mV
Lead Channel Sensing Intrinsic Amplitude: 3.625 mV
Lead Channel Sensing Intrinsic Amplitude: 3.625 mV
Lead Channel Setting Pacing Amplitude: 1.75 V
Lead Channel Setting Pacing Amplitude: 2.75 V
Lead Channel Setting Pacing Pulse Width: 0.4 ms
Lead Channel Setting Sensing Sensitivity: 1.2 mV
Zone Setting Status: 755011

## 2024-04-01 NOTE — Progress Notes (Deleted)
" °  Electrophysiology Office Note:   ID:  FRANCETTA Simon, DOB [birth year], MRN [id number]  Primary Cardiologist: None Electrophysiologist: Will Gladis Norton, MD  {Click to update primary MD,subspecialty MD or APP then REFRESH:1}    History of Present Illness:   Jacqueline Simon is a 74 y.o. female with h/o CHB s/p PPM, HTN, HLD, DM2, and obesity seen today for routine electrophysiology followup.   Since last being seen in our clinic the patient reports doing ***.  she denies chest pain, palpitations, dyspnea, PND, orthopnea, nausea, vomiting, dizziness, syncope, edema, weight gain, or early satiety.   Review of systems complete and found to be negative unless listed in HPI.   EP Information / Studies Reviewed:    EKG is ordered today. Personal review as below.       PPM Interrogation-  reviewed in detail today,  See PACEART report.  Arrhythmia/Device History CARELINK PPM MEDTRONIC-WC   Physical Exam:   VS:  There were no vitals taken for this visit.   Wt Readings from Last 3 Encounters:  02/26/23 208 lb (94.3 kg)  01/21/22 222 lb 3.2 oz (100.8 kg)  01/04/21 221 lb 3.2 oz (100.3 kg)     GEN: No acute distress  NECK: No JVD; No carotid bruits CARDIAC: {EPRHYTHM:28826}, no murmurs, rubs, gallops RESPIRATORY:  Clear to auscultation without rales, wheezing or rhonchi  ABDOMEN: Soft, non-tender, non-distended EXTREMITIES:  {EDEMA LEVEL:28147::No} edema; No deformity   ASSESSMENT AND PLAN:    CHB s/p Medtronic PPM  Normal PPM function See Pace Art report No changes today  HTN Stable on current regimen   HLD Continue statin   {Click here to Review PMH, Prob List, Meds, Allergies, SHx, FHx  :1}   Disposition:   Follow up with {EPPROVIDERS:28135::EP Team} {EPFOLLOW UP:28173}  Signed, Ozell Prentice Passey, PA-C  "

## 2024-04-02 ENCOUNTER — Ambulatory Visit: Attending: Student | Admitting: Student

## 2024-04-02 DIAGNOSIS — E7800 Pure hypercholesterolemia, unspecified: Secondary | ICD-10-CM

## 2024-04-02 DIAGNOSIS — I10 Essential (primary) hypertension: Secondary | ICD-10-CM

## 2024-04-02 DIAGNOSIS — I442 Atrioventricular block, complete: Secondary | ICD-10-CM

## 2024-04-03 ENCOUNTER — Ambulatory Visit: Payer: Self-pay | Admitting: Cardiology

## 2024-04-07 NOTE — Progress Notes (Signed)
 Remote PPM Transmission

## 2024-04-20 ENCOUNTER — Ambulatory Visit: Admitting: Podiatry

## 2024-05-19 ENCOUNTER — Ambulatory Visit: Admitting: Pulmonary Disease
